# Patient Record
Sex: Male | Born: 2000 | Hispanic: Yes | Marital: Single | Smoking: Former smoker
Health system: Southern US, Community
[De-identification: ages and names within clinical notes are randomized; demographics above are authoritative.]

## PROBLEM LIST (undated history)

## (undated) DIAGNOSIS — M549 Dorsalgia, unspecified: Secondary | ICD-10-CM

## (undated) DIAGNOSIS — M419 Scoliosis, unspecified: Secondary | ICD-10-CM

## (undated) HISTORY — PX: TONSILLECTOMY: SUR1361

## (undated) HISTORY — PX: RHINOPLASTY FOR CLEFT LIP / PALATE: SUR1285

## (undated) HISTORY — PX: CLEFT PALATE REPAIR: SUR1165

---
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
## 2018-03-19 ENCOUNTER — Other Ambulatory Visit: Payer: Self-pay

## 2018-03-19 ENCOUNTER — Emergency Department (HOSPITAL_COMMUNITY): Payer: Medicaid Other

## 2018-03-19 ENCOUNTER — Encounter (HOSPITAL_COMMUNITY): Payer: Self-pay | Admitting: Emergency Medicine

## 2018-03-19 ENCOUNTER — Emergency Department (HOSPITAL_COMMUNITY)
Admission: EM | Admit: 2018-03-19 | Discharge: 2018-03-19 | Disposition: A | Discharge status: Home / Self Care | Payer: Medicaid Other | Attending: Emergency Medicine | Admitting: Emergency Medicine

## 2018-03-19 DIAGNOSIS — R0789 Other chest pain: Secondary | ICD-10-CM | POA: Diagnosis not present

## 2018-03-19 DIAGNOSIS — F17200 Nicotine dependence, unspecified, uncomplicated: Secondary | ICD-10-CM | POA: Diagnosis not present

## 2018-03-19 DIAGNOSIS — R0982 Postnasal drip: Secondary | ICD-10-CM | POA: Diagnosis not present

## 2018-03-19 DIAGNOSIS — R0602 Shortness of breath: Secondary | ICD-10-CM | POA: Insufficient documentation

## 2018-03-19 DIAGNOSIS — J029 Acute pharyngitis, unspecified: Secondary | ICD-10-CM | POA: Diagnosis not present

## 2018-03-19 DIAGNOSIS — H6121 Impacted cerumen, right ear: Secondary | ICD-10-CM | POA: Insufficient documentation

## 2018-03-19 DIAGNOSIS — J069 Acute upper respiratory infection, unspecified: Secondary | ICD-10-CM | POA: Diagnosis not present

## 2018-03-19 DIAGNOSIS — R0981 Nasal congestion: Secondary | ICD-10-CM | POA: Diagnosis not present

## 2018-03-19 DIAGNOSIS — R05 Cough: Secondary | ICD-10-CM | POA: Diagnosis present

## 2018-03-19 DIAGNOSIS — B9789 Other viral agents as the cause of diseases classified elsewhere: Secondary | ICD-10-CM | POA: Diagnosis not present

## 2018-03-19 MED ORDER — FLUTICASONE PROPIONATE 50 MCG/ACT NA SUSP: 2.0000 | Freq: Every day | NASAL | 0 refills | Status: DC

## 2018-03-19 MED ORDER — DIPHENHYDRAMINE HCL 25 MG PO TABS: 25.0000 mg | ORAL_TABLET | Freq: Every evening | ORAL | 0 refills | Status: DC | PRN

## 2018-03-19 MED ORDER — CETIRIZINE HCL 10 MG PO TABS: 10.0000 mg | ORAL_TABLET | Freq: Every day | ORAL | 0 refills | Status: DC

## 2018-03-19 NOTE — ED Provider Notes (Signed)
[facility]-EMERGENCY DEPT Provider Note   CSN: [id number] Arrival date & time: 03/19/18  1952     History   Chief Complaint Chief Complaint  Patient presents with  . Cough  . Nasal Congestion    HPI Zachary Soto is a 17 y.o. male presents today for evaluation of acute onset, per aggressively worsening nasal congestion and cough for 5 days.  Patient states that he thinks this is seasonal allergies although he states he has never had allergies before.  He notes a cough productive of yellow sputum intermittently as well as intermittent shortness of breath which worsens when laying flat.  Notes a very mild aching sensation to the anterior chest wall when coughing only.  He denies fevers or chills.  He notes a mildly scratchy throat.  His tonsils were removed several years ago.  He has tried [employer] and over-the-counter cold medicine without relief of his symptoms.  He states his symptoms worsen when he works outside but he has been wearing a mask with which provide some relief.  No known sick contacts.  He is a non-smoker.  The history is provided by the patient.    Past Medical History:  Diagnosis Date  . Back pain   . Scoliosis     Patient Active Problem List   Diagnosis Date Noted  . Depression 07/06/2016  . 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 CLEFT PALATE REPAIR    . TONSILLECTOMY          Home Medications    Prior to Admission medications   Medication Sig Start Date End Date Taking? Authorizing Provider  cetirizine (ZYRTEC ALLERGY) 10 MG tablet Take 1 tablet (10 mg total) by mouth daily. 03/19/18   Demetric Parslow A, PA-C  diphenhydrAMINE (BENADRYL) 25 MG tablet Take 1 tablet (25 mg total) by mouth at bedtime as needed for itching or allergies. 03/19/18   Jazmaine Fuelling A, PA-C  fluticasone (FLONASE) 50 MCG/ACT nasal spray Place 2 sprays into both nostrils daily. 03/19/18   Jeanie Sewer, PA-C     Family History No family history on file.  Social History Social History   Tobacco Use  . Smoking status: Current Some Day Smoker  . Smokeless tobacco: Never Used  Substance Use Topics  . Alcohol use: Yes    Comment: "blunts this morning"  . Drug use: Yes    Types: Marijuana     Allergies   Patient has no known allergies.   Review of Systems Review of Systems  Constitutional: Negative for chills and fever.  HENT: Positive for congestion, postnasal drip and sore throat. Negative for drooling and trouble swallowing.   Respiratory: Positive for cough and shortness of breath.   Cardiovascular: Positive for chest pain (with cough only).     Physical Exam Updated Vital Signs BP (!) 144/93 (BP Location: Right Arm)   Pulse 82   Temp 98.6 F (37 C) (Oral)   Resp 16   Ht 5\' 5"  (1.651 m)   Wt 72.6 kg (160 lb)   SpO2 99%   BMI 26.63 kg/m   Physical Exam  Constitutional: He appears well-developed and well-nourished. No distress.  HENT:  Head: Normocephalic and atraumatic.  Right Ear: External ear normal.  Left Ear: External ear normal.  Right TM with tympanostomy tube, no mid ear effusion noted.  Right TM not visualized due to cerumen impaction.  No frontal or maxillary sinus tenderness.  Nasal  septum is midline with mucosal edema and clear rhinorrhea bilaterally.  Patient audibly congested.  He has well-healed surgical scars along the hard palate and upper lip to suggest cleft palate surgery.  Posterior oropharynx shows surgically absent tonsils, no erythema, no uvular deviation, no trismus.  There is postnasal drip.  Eyes: Pupils are equal, round, and reactive to light. Conjunctivae and EOM are normal. Right eye exhibits no discharge. Left eye exhibits no discharge.  Neck: Normal range of motion and full passive range of motion without pain. Neck supple. No JVD present. No tracheal deviation present.  Cardiovascular: Normal rate, regular rhythm and normal heart sounds.   Pulmonary/Chest: Effort normal and breath sounds normal. No stridor. No respiratory distress. He has no wheezes. He has no rales. He exhibits no tenderness.  Abdominal: Soft. Bowel sounds are normal. He exhibits no distension.  Musculoskeletal: He exhibits no edema.  Lymphadenopathy:    He has no cervical adenopathy.  Neurological: He is alert.  Skin: Skin is warm and dry. No erythema.  Psychiatric: He has a normal mood and affect. His behavior is normal.  Nursing note and vitals reviewed.    ED Treatments / Results  Labs (all labs ordered are listed, but only abnormal results are displayed) Labs Reviewed - No data to display  EKG None  Radiology Dg Chest 2 View  Result Date: 03/19/2018 CLINICAL DATA:  Cough and congestion EXAM: CHEST - 2 VIEW COMPARISON:  July 04, 2016 FINDINGS: Lungs are clear. Heart size and pulmonary vascularity are normal. No adenopathy. No bone lesions. IMPRESSION: No edema or consolidation. Electronically Signed   By: Bretta BangWilliam  Woodruff III M.D.   On: 03/19/2018 20:37    Procedures Procedures (including critical care time)  Medications Ordered in ED Medications - No data to display   Initial Impression / Assessment and Plan / ED Course  I have reviewed the triage vital signs and the nursing notes.  Pertinent labs & imaging results that were available during my care of the patient were reviewed by me and considered in my medical decision making (see chart for details).     Pt CXR negative for acute infiltrate. Patients symptoms are consistent with URI, likely viral etiology.  Seasonal allergies are also on the differential.  Patient is afebrile, vital signs are stable.  He is nontoxic in appearance.  No fever or meningeal signs to suggest meningitis presentation is not concerning for strep pharyngitis, PTA, or spread of infection to soft tissues.  Discussed that antibiotics are not indicated for viral infections. Pt will be discharged with symptomatic  treatment.  Recommend primary care follow-up if symptoms persist.  Discussed ED return precautions. Pt verbalized understanding of and agreement with plan and is safe for discharge home at this time.   Final Clinical Impressions(s) / ED Diagnoses   Final diagnoses:  Viral URI with cough    ED Discharge Orders        Ordered    fluticasone (FLONASE) 50 MCG/ACT nasal spray  Daily     03/19/18 2053    cetirizine (ZYRTEC ALLERGY) 10 MG tablet  Daily     03/19/18 2053    diphenhydrAMINE (BENADRYL) 25 MG tablet  At bedtime PRN     03/19/18 2053       Jeanie SewerFawze, Amberly Livas A, PA-C 03/19/18 2345    Tilden Fossaees, Elizabeth, MD 03/20/18 [phone]

## 2018-03-19 NOTE — Discharge Instructions (Addendum)
Your chest x-ray did not show any signs of pneumonia or fluid in the lungs.  Drink plenty of water and get plenty of rest.  Gargle warm salt water and spit it out for sore throat. May also use cough drops, warm teas, etc. Take flonase to decrease nasal congestion. Zyrtec for nasal congestion and scratchy throat.  Stop taking Allegra.  You may take Benadryl at night for further relief of your symptoms.  Alternate 600 mg of ibuprofen and [phone] mg of Tylenol every 3 hours as needed for pain. Do not exceed 4000 mg of Tylenol daily.   Followup with your primary care doctor in 5-7 days for recheck of ongoing symptoms. Return to emergency department for emergent changing or worsening of symptoms such as throat tightness, facial swelling, fever not controlled by ibuprofen or Tylenol,difficulty breathing, or chest pain.

## 2018-03-19 NOTE — ED Triage Notes (Signed)
Patient c/o nasal congestion and cough since Tuesday. Tried OTC medication with no relief. Speaking in full sentences.

## 2018-11-11 ENCOUNTER — Other Ambulatory Visit: Payer: Self-pay

## 2018-11-11 ENCOUNTER — Emergency Department (HOSPITAL_COMMUNITY)
Admission: EM | Admit: 2018-11-11 | Discharge: 2018-11-12 | Disposition: A | Discharge status: Home / Self Care | Payer: Medicaid Other | Attending: Emergency Medicine | Admitting: Emergency Medicine

## 2018-11-11 ENCOUNTER — Encounter (HOSPITAL_COMMUNITY): Payer: Self-pay | Admitting: Emergency Medicine

## 2018-11-11 DIAGNOSIS — S0502XA Injury of conjunctiva and corneal abrasion without foreign body, left eye, initial encounter: Secondary | ICD-10-CM | POA: Insufficient documentation

## 2018-11-11 DIAGNOSIS — X58XXXA Exposure to other specified factors, initial encounter: Secondary | ICD-10-CM | POA: Diagnosis not present

## 2018-11-11 DIAGNOSIS — Y9389 Activity, other specified: Secondary | ICD-10-CM | POA: Insufficient documentation

## 2018-11-11 DIAGNOSIS — F17200 Nicotine dependence, unspecified, uncomplicated: Secondary | ICD-10-CM | POA: Insufficient documentation

## 2018-11-11 DIAGNOSIS — Y929 Unspecified place or not applicable: Secondary | ICD-10-CM | POA: Diagnosis not present

## 2018-11-11 DIAGNOSIS — Y999 Unspecified external cause status: Secondary | ICD-10-CM | POA: Diagnosis not present

## 2018-11-11 DIAGNOSIS — S058X2A Other injuries of left eye and orbit, initial encounter: Secondary | ICD-10-CM | POA: Diagnosis present

## 2018-11-11 MED ORDER — FLUORESCEIN SODIUM 1 MG OP STRP
1.0000 | ORAL_STRIP | Freq: Once | OPHTHALMIC | Status: AC
  Administered 2018-11-12: 1 via OPHTHALMIC
  Filled 2018-11-11: qty 1

## 2018-11-11 MED ORDER — TETRACAINE HCL 0.5 % OP SOLN
2.0000 [drp] | Freq: Once | OPHTHALMIC | Status: AC
  Administered 2018-11-12: 2 [drp] via OPHTHALMIC
  Filled 2018-11-11: qty 4

## 2018-11-11 NOTE — ED Notes (Signed)
All materials at bedside. Eye irrigated

## 2018-11-11 NOTE — ED Triage Notes (Signed)
Pt from home with pain in left eye following possibly getting brick dust in eye at work. Pt has sensitivity to light. No foreign objects seen, but sclera is red

## 2018-11-12 MED ORDER — ERYTHROMYCIN 5 MG/GM OP OINT: TOPICAL_OINTMENT | OPHTHALMIC | 0 refills | Status: DC

## 2018-11-12 MED ORDER — ERYTHROMYCIN 5 MG/GM OP OINT
1.0000 "application " | TOPICAL_OINTMENT | Freq: Once | OPHTHALMIC | Status: AC
  Administered 2018-11-12: 1 via OPHTHALMIC
  Filled 2018-11-12: qty 3.5

## 2018-11-12 NOTE — Discharge Instructions (Signed)
1. Medications: Erythromycin (half inch ribbon of ointment into the lower eyelid every 4 hours for the next 5 days), usual home medications 2. Treatment: rest, drink plenty of fluids, try not to rub eye, Tylenol or ibuprofen for pain 3. Follow Up: Please followup with ophthalmology for further evaluation of your eye on Monday.  Please return to the ER for swelling of the eye, changes in vision, fevers or other concerns.

## 2018-11-12 NOTE — ED Provider Notes (Signed)
[facility]-EMERGENCY DEPT Provider Note   CSN: [id number] Arrival date & time: 11/11/18  2039     History   Chief Complaint Chief Complaint  Patient presents with  . Eye Pain    HPI Zachary LungerKevin Julio Soto Zachary Soto is a 17 y.o. male with no major medical problems presents to the Emergency Department complaining of acute, persistent left eye pain and irritation onset around 3:30 PM while working.  He reports a colleague was cutting bricks when he got some dust in his eye.  He did not wash his eye then but did wash it after returning from work later this evening.  He reports persistent feeling of foreign body.  He denies vision changes, fever, chills.  He denies being hit in the eye.  He does not wear contacts or glasses.  No aggravating or alleviating factors.  No treatments prior to arrival.   The history is provided by the patient, medical records and a significant other. No language interpreter was used.    Past Medical History:  Diagnosis Date  . Back pain   . Scoliosis     Patient Active Problem List   Diagnosis Date Noted  . Depression 07/06/2016  . 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 CLEFT PALATE REPAIR    . TONSILLECTOMY          Home Medications    Prior to Admission medications   Medication Sig Start Date End Date Taking? Authorizing Provider  erythromycin ophthalmic ointment Place a 1/2 inch ribbon of ointment into the lower eyelid every 4 hours for 5 days 11/12/18   Allessandra Bernardi, Dahlia ClientHannah, PA-C    Family History No family history on file.  Social History Social History   Tobacco Use  . Smoking status: Current Some Day Smoker  . Smokeless tobacco: Never Used  Substance Use Topics  . Alcohol use: Yes    Comment: "blunts this morning"  . Drug use: Yes    Types: Marijuana     Allergies   Patient has no known allergies.   Review of Systems Review of Systems  Constitutional: Negative  for fever.  HENT: Negative for congestion and facial swelling.   Eyes: Positive for photophobia, pain and redness.  Respiratory: Negative for shortness of breath.   Cardiovascular: Negative for chest pain.  Gastrointestinal: Negative for abdominal pain and vomiting.  Musculoskeletal: Negative for neck pain and neck stiffness.  Skin: Negative for wound.  Allergic/Immunologic: Negative for immunocompromised state.  Neurological: Negative for dizziness and light-headedness.  Hematological: Negative for adenopathy.  Psychiatric/Behavioral: Negative for confusion.     Physical Exam Updated Vital Signs BP (!) 135/99 (BP Location: Left Arm)   Pulse 67   Temp 97.6 F (36.4 C) (Oral)   Resp 18   Wt 71.2 kg   SpO2 98%   BMI 26.13 kg/m   Physical Exam  Constitutional: He is oriented to person, place, and time. He appears well-developed and well-nourished. No distress.  HENT:  Head: Normocephalic and atraumatic.  Nose: Nose normal. No mucosal edema or rhinorrhea.  Mouth/Throat: Uvula is midline, oropharynx is clear and moist and mucous membranes are normal. No uvula swelling. No oropharyngeal exudate, posterior oropharyngeal edema, posterior oropharyngeal erythema or tonsillar abscesses.  Eyes: Pupils are equal, round, and reactive to light. Conjunctivae, EOM and lids are normal. Lids are everted and swept, no foreign bodies found. Right eye exhibits no chemosis, no discharge and no exudate. No foreign body present  in the right eye. Left eye exhibits no chemosis, no discharge and no exudate. No foreign body present in the left eye. Right conjunctiva is not injected. Right conjunctiva has no hemorrhage. Left conjunctiva is not injected. Left conjunctiva has no hemorrhage.  Slit lamp exam:      The right eye shows no corneal abrasion, no corneal flare, no corneal ulcer, no foreign body, no fluorescein uptake and no anterior chamber bulge.       The left eye shows no corneal abrasion, no corneal  flare, no corneal ulcer, no foreign body, no fluorescein uptake and no anterior chamber bulge.  Pupils equal round and reactive to light No vertical, horizontal or rotational nystagmus Small corneal abrasion noted to the left eye at the 1-3 o'clock position with fluorescein uptake No visible foreign body No corneal flare, ulcer or dendritic staining  No herpetic lesions to the face or around the eye  Visual Acuity:  R - 20/50 L - 20/50  Neck: Normal range of motion.  Full range of motion without pain No midline or paraspinal tenderness No nuchal rigidity; no meningeal signs  Cardiovascular: Normal rate, regular rhythm and intact distal pulses.  Pulmonary/Chest: Effort normal. No respiratory distress.  Musculoskeletal: Normal range of motion.  Neurological: He is alert and oriented to person, place, and time.  Mental Status:  Alert, oriented, thought content appropriate. Speech fluent without evidence of aphasia. Able to follow 2 step commands without difficulty.   Cranial Nerves:  II:  Peripheral visual fields grossly normal, pupils equal, round, reactive to light III,IV, VI: ptosis not present, extra-ocular motions intact bilaterally  V,VII: smile symmetric, facial light touch sensation equal VIII: hearing grossly normal bilaterally  IX,X: gag reflex present  XI: bilateral shoulder shrug equal and strong XII: midline tongue extension   Skin: Skin is warm and dry. He is not diaphoretic. No erythema.  Psychiatric: He has a normal mood and affect.  Nursing note and vitals reviewed.    ED Treatments / Results   Procedures Procedures (including critical care time)  Medications Ordered in ED Medications  erythromycin ophthalmic ointment 1 application (has no administration in time range)  tetracaine (PONTOCAINE) 0.5 % ophthalmic solution 2 drop (2 drops Left Eye Given by Other 11/12/18 0033)  fluorescein ophthalmic strip 1 strip (1 strip Left Eye Given by Other 11/12/18 0034)      Initial Impression / Assessment and Plan / ED Course  I have reviewed the triage vital signs and the nursing notes.  Pertinent labs & imaging results that were available during my care of the patient were reviewed by me and considered in my medical decision making (see chart for details).     With eye irritation and feeling of foreign body.  I flushed with normal saline.  No visible foreign body.  Slit-lamp exam shows no embedded foreign body.  Fluorescein exam with small corneal abrasion.  Patient does not wear contact lenses.  No signs of current infection.  Negative Seidel sign and no evidence of globe rupture.  Patient given erythromycin and will be discharged home with close ophthalmology follow-up.  Final Clinical Impressions(s) / ED Diagnoses   Final diagnoses:  Abrasion of left cornea, initial encounter    ED Discharge Orders         Ordered    erythromycin ophthalmic ointment     11/12/18 0038           Cezar Misiaszek, Dahlia Client, PA-C 11/12/18 0041    Cardama, Amadeo Garnet, MD  11/12/18 0753  

## 2018-11-12 NOTE — ED Notes (Signed)
Pt verbalized discharge instructions and follow up care.   

## 2019-01-20 ENCOUNTER — Encounter (HOSPITAL_COMMUNITY): Payer: Self-pay | Admitting: Emergency Medicine

## 2019-01-20 ENCOUNTER — Ambulatory Visit (HOSPITAL_COMMUNITY)
Admission: EM | Admit: 2019-01-20 | Discharge: 2019-01-20 | Disposition: A | Discharge status: Home / Self Care | Payer: Medicaid Other | Attending: Internal Medicine | Admitting: Internal Medicine

## 2019-01-20 ENCOUNTER — Emergency Department (HOSPITAL_COMMUNITY)
Admission: EM | Admit: 2019-01-20 | Discharge: 2019-01-20 | Discharge status: Absent without leave | Payer: Medicaid Other

## 2019-01-20 DIAGNOSIS — A084 Viral intestinal infection, unspecified: Secondary | ICD-10-CM | POA: Diagnosis not present

## 2019-01-20 NOTE — ED Triage Notes (Signed)
Pt states on Wednesday he had fever and nausea and states he feels 100% better but his job requires him to have a note saying he can return to work.

## 2019-01-20 NOTE — ED Provider Notes (Signed)
[facility]    CSN: [id number] Arrival date & time: 01/20/19  1727     History   Chief Complaint Chief Complaint  Patient presents with  . Letter for School/Work    HPI Zachary Soto is a 18 y.o. male with a history of chronic back pain comes into the urgent care department with a request for letter so he can return to work.  Patient had been unwell with fever, nausea and diarrhea over the past couple of days.  He did not attend work.  His symptoms are beginning to improve.  Diarrhea has subsided.  Oral intake is improving.  Fever has subsided as well.  No relieving factors.  Patient denies any shortness of breath, chest pain at this time.   HPI  Past Medical History:  Diagnosis Date  . Back pain   . Scoliosis     Patient Active Problem List   Diagnosis Date Noted  . Depression 07/06/2016  . 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 CLEFT PALATE REPAIR    . TONSILLECTOMY         Home Medications    Prior to Admission medications   Medication Sig Start Date End Date Taking? Authorizing Provider  erythromycin ophthalmic ointment Place a 1/2 inch ribbon of ointment into the lower eyelid every 4 hours for 5 days Patient not taking: Reported on 01/20/2019 11/12/18   Muthersbaugh, Dahlia Client, PA-C    Family History No family history on file.  Social History Social History   Tobacco Use  . Smoking status: Current Some Day Smoker  . Smokeless tobacco: Never Used  Substance Use Topics  . Alcohol use: Yes    Comment: "blunts this morning"  . Drug use: Yes    Types: Marijuana     Allergies   Patient has no known allergies.   Review of Systems Review of Systems  Constitutional: Negative for activity change, appetite change and chills.  HENT: Negative for ear discharge, ear pain and rhinorrhea.   Respiratory: Negative for cough and shortness of breath.   Gastrointestinal: Negative for abdominal  distention, abdominal pain and nausea.  Musculoskeletal: Positive for arthralgias and back pain. Negative for gait problem, myalgias and neck pain.  Neurological: Negative for dizziness, weakness and light-headedness.     Physical Exam Triage Vital Signs ED Triage Vitals [01/20/19 1745]  Enc Vitals Group     BP (!) 167/93     Pulse Rate 74     Resp 18     Temp 98.7 F (37.1 C)     Temp src      SpO2 100 %     Weight      Height      Head Circumference      Peak Flow      Pain Score 0     Pain Loc      Pain Edu?      Excl. in GC?    No data found.  Updated Vital Signs BP (!) 167/93   Pulse 74   Temp 98.7 F (37.1 C)   Resp 18   SpO2 100%   Visual Acuity Right Eye Distance:   Left Eye Distance:   Bilateral Distance:    Right Eye Near:   Left Eye Near:    Bilateral Near:     Physical Exam Constitutional:      Appearance: Normal appearance. He is not ill-appearing.  HENT:  Right Ear: Tympanic membrane normal.     Left Ear: Tympanic membrane normal.  Cardiovascular:     Rate and Rhythm: Normal rate and regular rhythm.     Pulses: Normal pulses.     Heart sounds: Normal heart sounds.  Pulmonary:     Effort: Pulmonary effort is normal. No respiratory distress.     Breath sounds: Normal breath sounds. No wheezing.  Abdominal:     General: Abdomen is flat. Bowel sounds are normal. There is no distension.     Palpations: Abdomen is soft.     Tenderness: There is no abdominal tenderness.  Musculoskeletal: Normal range of motion.        General: No swelling or deformity.  Neurological:     Mental Status: He is alert.      UC Treatments / Results  Labs (all labs ordered are listed, but only abnormal results are displayed) Labs Reviewed - No data to display  EKG None  Radiology No results found.  Procedures Procedures (including critical care time)  Medications Ordered in UC Medications - No data to display  Initial Impression / Assessment  and Plan / UC Course  I have reviewed the triage vital signs and the nursing notes.  Pertinent labs & imaging results that were available during my care of the patient were reviewed by me and considered in my medical decision making (see chart for details).     1.  Acute gastroenteritis likely viral-improving: Continue oral intake Patient is clinically better. Return to work note is given to the patient  Final Clinical Impressions(s) / UC Diagnoses   Final diagnoses:  None   Discharge Instructions   None    ED Prescriptions    None     Controlled Substance Prescriptions [place] Controlled Substance Registry consulted? Not Applicable   Merrilee Jansky, MD 01/23/19 1001

## 2019-02-06 ENCOUNTER — Encounter (HOSPITAL_COMMUNITY): Payer: Self-pay | Admitting: Emergency Medicine

## 2019-02-06 ENCOUNTER — Emergency Department (HOSPITAL_COMMUNITY)
Admission: EM | Admit: 2019-02-06 | Discharge: 2019-02-06 | Disposition: A | Discharge status: Home / Self Care | Payer: Medicaid Other | Attending: Emergency Medicine | Admitting: Emergency Medicine

## 2019-02-06 DIAGNOSIS — R112 Nausea with vomiting, unspecified: Secondary | ICD-10-CM | POA: Diagnosis not present

## 2019-02-06 DIAGNOSIS — R197 Diarrhea, unspecified: Secondary | ICD-10-CM | POA: Insufficient documentation

## 2019-02-06 DIAGNOSIS — R05 Cough: Secondary | ICD-10-CM

## 2019-02-06 DIAGNOSIS — F17200 Nicotine dependence, unspecified, uncomplicated: Secondary | ICD-10-CM | POA: Diagnosis not present

## 2019-02-06 DIAGNOSIS — R059 Cough, unspecified: Secondary | ICD-10-CM

## 2019-02-06 LAB — CBC
HCT: 46.2 % (ref 39.0–52.0)
Hemoglobin: 15 g/dL (ref 13.0–17.0)
MCH: 31.4 pg (ref 26.0–34.0)
MCHC: 32.5 g/dL (ref 30.0–36.0)
MCV: 96.7 fL (ref 80.0–100.0)
Platelets: 243 10*3/uL (ref 150–400)
RBC: 4.78 MIL/uL (ref 4.22–5.81)
RDW: 12.2 % (ref 11.5–15.5)
WBC: 6.8 10*3/uL (ref 4.0–10.5)
nRBC: 0 % (ref 0.0–0.2)

## 2019-02-06 LAB — COMPREHENSIVE METABOLIC PANEL
ALT: 53 U/L — ABNORMAL HIGH (ref 0–44)
AST: 42 U/L — ABNORMAL HIGH (ref 15–41)
Albumin: 4.8 g/dL (ref 3.5–5.0)
Alkaline Phosphatase: 89 U/L (ref 38–126)
Anion gap: 6 (ref 5–15)
BUN: 15 mg/dL (ref 6–20)
CO2: 27 mmol/L (ref 22–32)
Calcium: 9 mg/dL (ref 8.9–10.3)
Chloride: 106 mmol/L (ref 98–111)
Creatinine, Ser: 0.84 mg/dL (ref 0.61–1.24)
GFR calc non Af Amer: >60 mL/min (ref >60)
Glucose, Bld: 134 mg/dL — ABNORMAL HIGH (ref 70–99)
Potassium: 3.5 mmol/L (ref 3.5–5.1)
Sodium: 139 mmol/L (ref 135–145)
Total Bilirubin: 0.5 mg/dL (ref 0.3–1.2)
Total Protein: 7.2 g/dL (ref 6.5–8.1)

## 2019-02-06 LAB — LIPASE, BLOOD: Lipase: 29 U/L (ref 11–51)

## 2019-02-06 MED ORDER — METOCLOPRAMIDE HCL 10 MG PO TABS: 10.0000 mg | ORAL_TABLET | Freq: Four times a day (QID) | ORAL | 0 refills | Status: DC | PRN

## 2019-02-06 NOTE — ED Provider Notes (Signed)
[facility]-EMERGENCY DEPT Provider Note   CSN: [id number] Arrival date & time: 02/06/19  1605    History   Chief Complaint Chief Complaint  Patient presents with  . Cough  . Emesis  . Diarrhea    HPI Zachary Soto Zachary Soto is a 18 y.o. male here presenting with cough, nausea and vomiting and diarrhea.  Patient states that he had to miss work because he was coughing and had an episode of nausea vomiting.  He also had some loose stools this morning.  States that he is able to keep down some fluids after he vomited.  Denies any fevers or chills.  About 2 weeks ago, he went to urgent care for similar symptoms and states that it resolved on its own.  He told me that he needs a work note to go back to work.     The history is provided by the patient.    Past Medical History:  Diagnosis Date  . Back pain   . Scoliosis     Patient Active Problem List   Diagnosis Date Noted  . Depression 07/06/2016  . 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 CLEFT PALATE REPAIR    . TONSILLECTOMY          Home Medications    Prior to Admission medications   Medication Sig Start Date End Date Taking? Authorizing Provider  erythromycin ophthalmic ointment Place a 1/2 inch ribbon of ointment into the lower eyelid every 4 hours for 5 days Patient not taking: Reported on 01/20/2019 11/12/18   Muthersbaugh, Dahlia Client, PA-C    Family History No family history on file.  Social History Social History   Tobacco Use  . Smoking status: Current Some Day Smoker  . Smokeless tobacco: Never Used  Substance Use Topics  . Alcohol use: Yes    Comment: "blunts this morning"  . Drug use: Yes    Types: Marijuana     Allergies   Patient has no known allergies.   Review of Systems Review of Systems  Respiratory: Positive for cough.   Gastrointestinal: Positive for diarrhea and vomiting.  All other systems reviewed and are  negative.    Physical Exam Updated Vital Signs BP 139/73 (BP Location: Right Arm)   Pulse 72   Temp (!) 97.3 F (36.3 C) (Oral)   Resp 16   SpO2 99%   Physical Exam Vitals signs and nursing note reviewed.  HENT:     Head: Normocephalic.     Right Ear: Tympanic membrane normal.     Left Ear: Tympanic membrane normal.     Nose: Nose normal.     Mouth/Throat:     Mouth: Mucous membranes are moist.  Eyes:     Extraocular Movements: Extraocular movements intact.     Pupils: Pupils are equal, round, and reactive to light.  Neck:     Musculoskeletal: Normal range of motion.  Cardiovascular:     Rate and Rhythm: Normal rate and regular rhythm.  Pulmonary:     Effort: Pulmonary effort is normal.     Breath sounds: Normal breath sounds.  Abdominal:     General: Abdomen is flat.     Palpations: Abdomen is soft.  Musculoskeletal: Normal range of motion.  Skin:    General: Skin is warm.     Capillary Refill: Capillary refill takes less than 2 seconds.  Neurological:     General: No focal deficit present.  Mental Status: He is alert.  Psychiatric:        Mood and Affect: Mood normal.        Behavior: Behavior normal.      ED Treatments / Results  Labs (all labs ordered are listed, but only abnormal results are displayed) Labs Reviewed  COMPREHENSIVE METABOLIC PANEL - Abnormal; Notable for the following components:      Result Value   Glucose, Bld 134 (*)    AST 42 (*)    ALT 53 (*)    All other components within normal limits  LIPASE, BLOOD  CBC  URINALYSIS, ROUTINE W REFLEX MICROSCOPIC    EKG None  Radiology No results found.  Procedures Procedures (including critical care time)  Medications Ordered in ED Medications - No data to display   Initial Impression / Assessment and Plan / ED Course  I have reviewed the triage vital signs and the nursing notes.  Pertinent labs & imaging results that were available during my care of the patient were  reviewed by me and considered in my medical decision making (see chart for details).       Gionny Heidorn Shelley Barbian is a 18 y.o. male here with cough, vomiting, diarrhea. Lungs clear. Abdomen nontender. Tolerated PO prior to arrival. Labs showed mildly elevated LFTs but abdomen nontender. I think likely viral gastro. Will dc home with reglan prn. Able to go back to work.    Final Clinical Impressions(s) / ED Diagnoses   Final diagnoses:  None    ED Discharge Orders    None       Charlynne Pander, MD 02/06/19 2123

## 2019-02-06 NOTE — Discharge Instructions (Signed)
Stay hydrated   You may go back to work   Take reglan as needed for nausea or vomiting   See your doctor  Return to ER if you have worse vomiting, abdominal pain, cough, fever

## 2019-02-06 NOTE — ED Triage Notes (Signed)
Pt reports that yesterday started having cough with n/v/d.

## 2019-11-07 ENCOUNTER — Encounter: Payer: Self-pay | Admitting: Emergency Medicine

## 2019-11-07 ENCOUNTER — Other Ambulatory Visit: Payer: Self-pay

## 2019-11-07 ENCOUNTER — Emergency Department
Admission: EM | Admit: 2019-11-07 | Discharge: 2019-11-07 | Disposition: A | Discharge status: Home / Self Care | Payer: Medicaid Other | Attending: Student in an Organized Health Care Education/Training Program | Admitting: Student in an Organized Health Care Education/Training Program

## 2019-11-07 DIAGNOSIS — H6501 Acute serous otitis media, right ear: Secondary | ICD-10-CM | POA: Diagnosis not present

## 2019-11-07 DIAGNOSIS — H9203 Otalgia, bilateral: Secondary | ICD-10-CM | POA: Diagnosis present

## 2019-11-07 DIAGNOSIS — H6500 Acute serous otitis media, unspecified ear: Secondary | ICD-10-CM

## 2019-11-07 DIAGNOSIS — Z87891 Personal history of nicotine dependence: Secondary | ICD-10-CM | POA: Insufficient documentation

## 2019-11-07 MED ORDER — PREDNISONE 10 MG PO TABS: 30.0000 mg | ORAL_TABLET | Freq: Every day | ORAL | 0 refills | Status: DC

## 2019-11-07 MED ORDER — AMOXICILLIN-POT CLAVULANATE 875-125 MG PO TABS: 1.0000 | ORAL_TABLET | Freq: Two times a day (BID) | ORAL | 0 refills | Status: AC

## 2019-11-07 MED ORDER — HYDROCODONE-ACETAMINOPHEN 5-325 MG PO TABS: 1.0000 | ORAL_TABLET | Freq: Four times a day (QID) | ORAL | 0 refills | Status: DC | PRN

## 2019-11-07 NOTE — Discharge Instructions (Addendum)
Follow-up with Dr. Tami Ribas.  Please call for an appointment.  Use medications as prescribed.  Take the pain medication only if needed.  Return if worsening.

## 2019-11-07 NOTE — ED Triage Notes (Signed)
Patient ambulatory to triage with steady gait, without difficulty or distress noted, mask in place; pt reports bilat earache and drainage "for months"; st seen for same 81mos ago and admin med without relief; denies any recent illness or fever

## 2019-11-07 NOTE — ED Provider Notes (Signed)
[facility] Emergency Department Provider Note  ____________________________________________   First MD Initiated Contact with Patient 11/07/19 2128     (approximate)  I have reviewed the triage vital signs and the nursing notes.   HISTORY  Chief Complaint Otalgia    HPI Zachary Soto is a 18 y.o. male presents emergency department complaint of bilateral ear pain.  States he has drainage from the left ear.  Increased pain in the right ear.  No fever or chills.  History of otitis media which was treated 2 months ago.  States he does not feel like it ever cleared up.  He continued to have drainage from the left ear.  States now he cannot hear out of the right ear.  No sore throat.  No headache.    Past Medical History:  Diagnosis Date  . Back pain   . Scoliosis     Patient Active Problem List   Diagnosis Date Noted  . Depression 07/06/2016  . 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 CLEFT PALATE REPAIR    . TONSILLECTOMY      Prior to Admission medications   Medication Sig Start Date End Date Taking? Authorizing Provider  amoxicillin-clavulanate (AUGMENTIN) 875-125 MG tablet Take 1 tablet by mouth 2 (two) times daily for 7 days. 11/07/19 11/14/19  Jerry Haugen, Roselyn Bering, PA-C  HYDROcodone-acetaminophen (NORCO/VICODIN) 5-325 MG tablet Take 1 tablet by mouth every 6 (six) hours as needed for moderate pain. 11/07/19   Eberardo Demello, Roselyn Bering, PA-C  metoCLOPramide (REGLAN) 10 MG tablet Take 1 tablet (10 mg total) by mouth every 6 (six) hours as needed for nausea (nausea/headache). 02/06/19   Charlynne Pander, MD  predniSONE (DELTASONE) 10 MG tablet Take 3 tablets (30 mg total) by mouth daily with breakfast. 11/07/19   Sherrie Mustache Roselyn Bering, PA-C    Allergies Patient has no known allergies.  No family history on file.  Social History Social History   Tobacco Use  . Smoking status: Former Games developer  . Smokeless tobacco:  Never Used  Substance Use Topics  . Alcohol use: Yes    Comment: "blunts this morning"  . Drug use: Yes    Types: Marijuana    Review of Systems  Constitutional: No fever/chills Eyes: No visual changes. ENT: No sore throat.  Positive for bilateral ear pain Respiratory: Denies cough Genitourinary: Negative for dysuria. Musculoskeletal: Negative for back pain. Skin: Negative for rash.    ____________________________________________   PHYSICAL EXAM:  VITAL SIGNS: ED Triage Vitals [11/07/19 1944]  Enc Vitals Group     BP (!) 143/92     Pulse Rate 90     Resp 18     Temp 98.5 F (36.9 C)     Temp Source Oral     SpO2 100 %     Weight 160 lb (72.6 kg)     Height 5\' 5"  (1.651 m)     Head Circumference      Peak Flow      Pain Score 8     Pain Loc      Pain Edu?      Excl. in GC?     Constitutional: Alert and oriented. Well appearing and in no acute distress. Eyes: Conjunctivae are normal.  Head: Atraumatic. Ears: TM on the right is swollen with purulent exudate at the tm, left tm is clear with some drainage in the ear canal Nose: No congestion/rhinnorhea. Mouth/Throat: Mucous membranes are  moist.   Neck:  supple no lymphadenopathy noted Cardiovascular: Normal rate, regular rhythm. Heart sounds are normal Respiratory: Normal respiratory effort.  No retractions, lungs c t a  GU: deferred Musculoskeletal: FROM all extremities, warm and well perfused Neurologic:  Normal speech and language.  Skin:  Skin is warm, dry and intact. No rash noted. Psychiatric: Mood and affect are normal. Speech and behavior are normal.  ____________________________________________   LABS (all labs ordered are listed, but only abnormal results are displayed)  Labs Reviewed - No data to display ____________________________________________   ____________________________________________  RADIOLOGY    ____________________________________________   PROCEDURES  Procedure(s)  performed: No  Procedures    ____________________________________________   INITIAL IMPRESSION / ASSESSMENT AND PLAN / ED COURSE  Pertinent labs & imaging results that were available during my care of the patient were reviewed by me and considered in my medical decision making (see chart for details).   Patient is 18 year old male presents emergency department with complaints of bilateral ear pain.  Physical exam shows bilateral TM swelling.  Drainage noted from the left TM.  Right TM is full of  Pus.  Explained findings to the patient.  Due to the recurrent ear infection I explained that he needs to follow-up with ENT.  He was given prescription for Augmentin.  Prednisone 30 mg daily for 3 days.  And Vicodin for pain.  He states he understands instructions.  Is discharged stable condition.    Zachary Soto was evaluated in Emergency Department on 11/07/2019 for the symptoms described in the history of present illness. He was evaluated in the context of the global COVID-19 pandemic, which necessitated consideration that the patient might be at risk for infection with the SARS-CoV-2 virus that causes COVID-19. Institutional protocols and algorithms that pertain to the evaluation of patients at risk for COVID-19 are in a state of rapid change based on information released by regulatory bodies including the CDC and federal and state organizations. These policies and algorithms were followed during the patient's care in the ED.   As part of my medical decision making, I reviewed the following data within the [place] notes reviewed and incorporated, Old chart reviewed, Notes from prior ED visits and [place] Controlled Substance Database  ____________________________________________   FINAL CLINICAL IMPRESSION(S) / ED DIAGNOSES  Final diagnoses:  Acute serous otitis media, recurrence not specified, unspecified laterality      NEW MEDICATIONS STARTED  DURING THIS VISIT:  New Prescriptions   AMOXICILLIN-CLAVULANATE (AUGMENTIN) 875-125 MG TABLET    Take 1 tablet by mouth 2 (two) times daily for 7 days.   HYDROCODONE-ACETAMINOPHEN (NORCO/VICODIN) 5-325 MG TABLET    Take 1 tablet by mouth every 6 (six) hours as needed for moderate pain.   PREDNISONE (DELTASONE) 10 MG TABLET    Take 3 tablets (30 mg total) by mouth daily with breakfast.     Note:  This document was prepared using Dragon voice recognition software and may include unintentional dictation errors.    Versie Starks, PA-C 11/07/19 2221    Merlyn Lot, MD 11/08/19 [phone]

## 2020-06-17 ENCOUNTER — Emergency Department
Admission: EM | Admit: 2020-06-17 | Discharge: 2020-06-17 | Disposition: A | Discharge status: Home / Self Care | Payer: Medicare Other | Attending: Emergency Medicine | Admitting: Emergency Medicine

## 2020-06-17 ENCOUNTER — Emergency Department: Payer: Medicare Other

## 2020-06-17 ENCOUNTER — Encounter: Payer: Self-pay | Admitting: Emergency Medicine

## 2020-06-17 DIAGNOSIS — Z87891 Personal history of nicotine dependence: Secondary | ICD-10-CM | POA: Insufficient documentation

## 2020-06-17 DIAGNOSIS — Y929 Unspecified place or not applicable: Secondary | ICD-10-CM | POA: Insufficient documentation

## 2020-06-17 DIAGNOSIS — Y939 Activity, unspecified: Secondary | ICD-10-CM | POA: Diagnosis not present

## 2020-06-17 DIAGNOSIS — M25512 Pain in left shoulder: Secondary | ICD-10-CM | POA: Diagnosis not present

## 2020-06-17 DIAGNOSIS — S161XXA Strain of muscle, fascia and tendon at neck level, initial encounter: Secondary | ICD-10-CM | POA: Insufficient documentation

## 2020-06-17 DIAGNOSIS — Y999 Unspecified external cause status: Secondary | ICD-10-CM | POA: Diagnosis not present

## 2020-06-17 DIAGNOSIS — S199XXA Unspecified injury of neck, initial encounter: Secondary | ICD-10-CM | POA: Diagnosis present

## 2020-06-17 DIAGNOSIS — X58XXXA Exposure to other specified factors, initial encounter: Secondary | ICD-10-CM | POA: Diagnosis not present

## 2020-06-17 DIAGNOSIS — M79602 Pain in left arm: Secondary | ICD-10-CM

## 2020-06-17 MED ORDER — IBUPROFEN 600 MG PO TABS
600.0000 mg | ORAL_TABLET | Freq: Once | ORAL | Status: AC
  Administered 2020-06-17: 600 mg via ORAL
  Filled 2020-06-17: qty 1

## 2020-06-17 MED ORDER — NAPROXEN 500 MG PO TABS: 500.0000 mg | ORAL_TABLET | Freq: Two times a day (BID) | ORAL | 0 refills | Status: DC

## 2020-06-17 MED ORDER — CYCLOBENZAPRINE HCL 10 MG PO TABS: 10.0000 mg | ORAL_TABLET | Freq: Three times a day (TID) | ORAL | 0 refills | Status: DC | PRN

## 2020-06-17 MED ORDER — TRAMADOL HCL 50 MG PO TABS
50.0000 mg | ORAL_TABLET | Freq: Once | ORAL | Status: AC
  Administered 2020-06-17: 50 mg via ORAL
  Filled 2020-06-17: qty 1

## 2020-06-17 NOTE — Discharge Instructions (Signed)
Follow discharge care instruction take medication as directed. °

## 2020-06-17 NOTE — ED Notes (Signed)
Pt states he was wrestling with his buddy and he hurt his left shoulder and neck. Pt states pain and difficulty sleeping.

## 2020-06-17 NOTE — ED Triage Notes (Signed)
C/O upper back, neck, and left arm pain since Thursday.  States injured self while wrestling with a friend.  AAOx3.  Skin warm and dry. NAD

## 2020-06-17 NOTE — ED Notes (Signed)
Esign not working pt verbalized discharge instructions  

## 2020-06-17 NOTE — ED Provider Notes (Signed)
[facility] Emergency Department Provider Note   ____________________________________________   First MD Initiated Contact with Patient 06/17/20 1115     (approximate)  I have reviewed the triage vital signs and the nursing notes.   HISTORY  Chief Complaint Back Pain, Neck Pain, and Arm Pain  HPI Zachary Soto is a 19 y.o. male patient complaining of neck and left shoulder pain secondary to a recent incident 4 days ago.  Patient state he fell landing on his left shoulder.  Patient denies LOC or head injury.  Patient also complain of pain with swallowing.  Patient denies radicular component to his neck pain.  Patient rates pain as a 10/10.  Patient described pain is "achy".  Patient the pain increased in the shoulder with abduction and overhead reaching.  No palliative measure for complaint.         Past Medical History:  Diagnosis Date  . Back pain   . Scoliosis     Patient Active Problem List   Diagnosis Date Noted  . Depression 07/06/2016  . 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 CLEFT PALATE REPAIR    . TONSILLECTOMY      Prior to Admission medications   Medication Sig Start Date End Date Taking? Authorizing Provider  cyclobenzaprine (FLEXERIL) 10 MG tablet Take 1 tablet (10 mg total) by mouth 3 (three) times daily as needed. 06/17/20   Joni Reining, PA-C  HYDROcodone-acetaminophen (NORCO/VICODIN) 5-325 MG tablet Take 1 tablet by mouth every 6 (six) hours as needed for moderate pain. 11/07/19   Fisher, Roselyn Bering, PA-C  metoCLOPramide (REGLAN) 10 MG tablet Take 1 tablet (10 mg total) by mouth every 6 (six) hours as needed for nausea (nausea/headache). 02/06/19   Charlynne Pander, MD  naproxen (NAPROSYN) 500 MG tablet Take 1 tablet (500 mg total) by mouth 2 (two) times daily with a meal. 06/17/20   Joni Reining, PA-C  predniSONE (DELTASONE) 10 MG tablet Take 3 tablets (30 mg total) by mouth  daily with breakfast. 11/07/19   Sherrie Mustache Roselyn Bering, PA-C    Allergies Patient has no known allergies.  No family history on file.  Social History Social History   Tobacco Use  . Smoking status: Former Games developer  . Smokeless tobacco: Never Used  Vaping Use  . Vaping Use: Never used  Substance Use Topics  . Alcohol use: Yes    Comment: "blunts this morning"  . Drug use: Yes    Types: Marijuana    Review of Systems Constitutional: No fever/chills Eyes: No visual changes. ENT: No sore throat.dysphagia Cardiovascular: Denies chest pain. Respiratory: Denies shortness of breath. Gastrointestinal: No abdominal pain.  No nausea, no vomiting.  No diarrhea.  No constipation. Genitourinary: Negative for dysuria. Musculoskeletal: Neck and left shoulder pain. Skin: Negative for rash. Neurological: Negative for headaches, focal weakness or numbness. Psychiatric:  Depression and overdose.   ____________________________________________   PHYSICAL EXAM:  VITAL SIGNS: ED Triage Vitals  Enc Vitals Group     BP 06/17/20 1052 (!) 145/98     Pulse Rate 06/17/20 1052 (!) 16     Resp --      Temp 06/17/20 1052 97.8 F (36.6 C)     Temp Source 06/17/20 1052 Oral     SpO2 06/17/20 1052 99 %     Weight 06/17/20 1052 160 lb 0.9 oz (72.6 kg)     Height 06/17/20 1052 5\' 5"  (1.651 m)  Head Circumference --      Peak Flow --      Pain Score 06/17/20 1051 10     Pain Loc --      Pain Edu? --      Excl. in GC? --    Constitutional: Alert and oriented. Well appearing and in no acute distress. Mouth/Throat: Mucous membranes are moist.  Oropharynx non-erythematous. Neck: No stridor.  cervical spine tenderness to palpation at C4-5 Hematological/Lymphatic/Immunilogical: No cervical lymphadenopathy. Cardiovascular: Normal rate, regular rhythm. Grossly normal heart sounds.  Good peripheral circulation. Respiratory: Normal respiratory effort.  No retractions. Lungs CTAB. Musculoskeletal: No  obvious deformity to the cervical spine or the left shoulder.   Neurologic:  Normal speech and language. No gross focal neurologic deficits are appreciated. No gait instability. Skin:  Skin is warm, dry and intact.  No abrasion or ecchymosis. Psychiatric: Mood and affect are normal. Speech and behavior are normal.  ____________________________________________   LABS (all labs ordered are listed, but only abnormal results are displayed)  Labs Reviewed - No data to display ____________________________________________  EKG   ____________________________________________  RADIOLOGY  ED MD interpretation:    Official radiology report(s): DG Cervical Spine 2-3 Views  Result Date: 06/17/2020 CLINICAL DATA:  Upper back, neck and left arm pain for 5 days. Injury wrestling. EXAM: CERVICAL SPINE - 2-3 VIEW COMPARISON:  None. FINDINGS: The prevertebral soft tissues are normal. The alignment is anatomic through T1. There is no evidence of acute fracture or traumatic subluxation. The disc spaces appear preserved. IMPRESSION: Negative three-view cervical spine radiographs. Electronically Signed   By: Carey Bullocks M.D.   On: 06/17/2020 12:07   DG Shoulder Left  Result Date: 06/17/2020 CLINICAL DATA:  Fall 4 days ago.  Pain EXAM: LEFT SHOULDER - 2+ VIEW COMPARISON:  None. FINDINGS: There is no evidence of fracture or dislocation. There is no evidence of arthropathy or other focal bone abnormality. Soft tissues are unremarkable. IMPRESSION: Negative. Electronically Signed   By: Charlett Nose M.D.   On: 06/17/2020 12:06    ____________________________________________   PROCEDURES  Procedure(s) performed (including Critical Care):  Procedures   ____________________________________________   INITIAL IMPRESSION / ASSESSMENT AND PLAN / ED COURSE  As part of my medical decision making, I reviewed the following data within the electronic MEDICAL RECORD NUMBER     Patient presents with neck and  left shoulder pain secondary to recent incident 4 days ago.  Discussed no acute findings on x-ray of cervical spine and the left shoulder.  Patient complaint physical exam is consistent with muscle skeletal pain and cervical strain.  Patient given discharge care instruction advised take medication as directed.  Patient [facility]    Zachary Soto was evaluated in Emergency Department on 06/17/2020 for the symptoms described in the history of present illness. He was evaluated in the context of the global COVID-19 pandemic, which necessitated consideration that the patient might be at risk for infection with the SARS-CoV-2 virus that causes COVID-19. Institutional protocols and algorithms that pertain to the evaluation of patients at risk for COVID-19 are in a state of rapid change based on information released by regulatory bodies including the CDC and federal and state organizations. These policies and algorithms were followed during the patient's care in the ED.       ____________________________________________   FINAL CLINICAL IMPRESSION(S) / ED DIAGNOSES  Final diagnoses:  Strain of neck muscle, initial encounter  Left arm pain  ED Discharge Orders         Ordered    naproxen (NAPROSYN) 500 MG tablet  2 times daily with meals     Discontinue  Reprint     06/17/20 1226    cyclobenzaprine (FLEXERIL) 10 MG tablet  3 times daily PRN     Discontinue  Reprint     06/17/20 1226           Note:  This document was prepared using Dragon voice recognition software and may include unintentional dictation errors.    Joni Reining, PA-C 06/17/20 1242    Shaune Pollack, MD 06/18/20 [phone]

## 2020-06-19 ENCOUNTER — Telehealth: Payer: Self-pay | Admitting: General Practice

## 2020-06-19 NOTE — Telephone Encounter (Signed)
Contacted individual to confirm that he has Medicare and informed him of where he can use the insurance.

## 2020-08-24 ENCOUNTER — Encounter (HOSPITAL_COMMUNITY): Payer: Self-pay | Admitting: Emergency Medicine

## 2020-08-24 ENCOUNTER — Other Ambulatory Visit: Payer: Self-pay

## 2020-08-24 ENCOUNTER — Emergency Department (HOSPITAL_COMMUNITY)
Admission: EM | Admit: 2020-08-24 | Discharge: 2020-08-24 | Disposition: A | Discharge status: Home / Self Care | Payer: Medicare Other | Attending: Emergency Medicine | Admitting: Emergency Medicine

## 2020-08-24 DIAGNOSIS — H9201 Otalgia, right ear: Secondary | ICD-10-CM | POA: Diagnosis present

## 2020-08-24 DIAGNOSIS — H6691 Otitis media, unspecified, right ear: Secondary | ICD-10-CM | POA: Diagnosis not present

## 2020-08-24 DIAGNOSIS — H60501 Unspecified acute noninfective otitis externa, right ear: Secondary | ICD-10-CM

## 2020-08-24 DIAGNOSIS — R519 Headache, unspecified: Secondary | ICD-10-CM | POA: Diagnosis not present

## 2020-08-24 DIAGNOSIS — Z87891 Personal history of nicotine dependence: Secondary | ICD-10-CM | POA: Insufficient documentation

## 2020-08-24 DIAGNOSIS — H6690 Otitis media, unspecified, unspecified ear: Secondary | ICD-10-CM

## 2020-08-24 MED ORDER — AMOXICILLIN 500 MG PO CAPS: 500.0000 mg | ORAL_CAPSULE | Freq: Three times a day (TID) | ORAL | 0 refills | Status: AC

## 2020-08-24 MED ORDER — CIPROFLOXACIN-DEXAMETHASONE 0.3-0.1 % OT SUSP: 4.0000 [drp] | Freq: Two times a day (BID) | OTIC | 0 refills | Status: AC

## 2020-08-24 NOTE — ED Triage Notes (Signed)
C/o bilateral ear pain with drainage x 1 week.  [place]  Also reports headache.  Tested negative for COVID within the last week.

## 2020-08-24 NOTE — Discharge Instructions (Addendum)
You were evaluated in the Emergency Department and after careful evaluation, we did not find any emergent condition requiring admission or further testing in the hospital.  Your exam/testing today was overall reassuring.  Symptoms seem to be due to inner and outer ear infections.  Please take the antibiotic pills as directed and also take the antibiotic drops in both ears as directed.  Continue Tylenol or Motrin for headaches as needed.  Please return to the Emergency Department if you experience any worsening of your condition.  Thank you for allowing [place] to be a part of your care.

## 2020-08-24 NOTE — ED Provider Notes (Signed)
[facility]PT [facility] Emergency Department Provider Note MRN:  [id number]  Arrival date & time: 08/24/20     Chief Complaint   Otalgia and Headache   History of Present Illness   Zachary Soto is a 19 y.o. year-old male with no pertinent past medical history presenting to the ED with chief complaint of ear pain.  1 week of ear pain, right greater than left, seems to [place].  Has noted drainage from the right ear.  Endorsing pressure-like frontal headaches since the ear pain began.  Denies fever, no neck pain, no chest pain, no shortness of breath, no other complaints.  Review of Systems  A complete 10 system review of systems was obtained and all systems are negative except as noted in the HPI and PMH.   Patient's Health History    Past Medical History:  Diagnosis Date  . Back pain   . Scoliosis     Past Surgical History:  Procedure Laterality Date  . CLEFT PALATE REPAIR    . TONSILLECTOMY      No family history on file.  Social History   Socioeconomic History  . Marital status: Single    Spouse name: Not on file  . Number of children: Not on file  . Years of education: Not on file  . Highest education level: Not on file  Occupational History  . Not on file  Tobacco Use  . Smoking status: Former Games developer  . Smokeless tobacco: Never Used  Vaping Use  . Vaping Use: Never used  Substance and Sexual Activity  . Alcohol use: Yes    Comment: "blunts this morning"  . Drug use: Yes    Types: Marijuana  . Sexual activity: Not on file  Other Topics Concern  . Not on file  Social History Narrative  . Not on file   Social Determinants of Health   Financial Resource Strain:   . Difficulty of Paying Living Expenses: Not on file  Food Insecurity:   . Worried About Programme researcher, broadcasting/film/video in the Last Year: Not on file  . Ran Out of Food in the Last Year: Not on file  Transportation Needs:   . Lack of  Transportation (Medical): Not on file  . Lack of Transportation (Non-Medical): Not on file  Physical Activity:   . Days of Exercise per Week: Not on file  . Minutes of Exercise per Session: Not on file  Stress:   . Feeling of Stress : Not on file  Social Connections:   . Frequency of Communication with Friends and Family: Not on file  . Frequency of Social Gatherings with Friends and Family: Not on file  . Attends Religious Services: Not on file  . Active Member of Clubs or Organizations: Not on file  . Attends Banker Meetings: Not on file  . Marital Status: Not on file  Intimate Partner Violence:   . Fear of Current or Ex-Partner: Not on file  . Emotionally Abused: Not on file  . Physically Abused: Not on file  . Sexually Abused: Not on file     Physical Exam   Vitals:   08/24/20 1042  BP: (!) 134/93  Pulse: 83  Resp: 18  Temp: 99 F (37.2 C)  SpO2: 97%    CONSTITUTIONAL: Well-appearing, NAD NEURO:  Alert and oriented x 3, normal and symmetric strength and sensation, normal coordination, normal speech EYES:  eyes equal and reactive ENT/NECK:  no LAD, no JVD; erythematous left TM with effusion, erythematous right external ear canal with purulent drainage CARDIO: Regular rate, well-perfused, normal S1 and S2 PULM:  CTAB no wheezing or rhonchi GI/GU:  normal bowel sounds, non-distended, non-tender MSK/SPINE:  No gross deformities, no edema SKIN:  no rash, atraumatic PSYCH:  Appropriate speech and behavior  *Additional and/or pertinent findings included in MDM below  Diagnostic and Interventional Summary    EKG Interpretation  Date/Time:    Ventricular Rate:    PR Interval:    QRS Duration:   QT Interval:    QTC Calculation:   R Axis:     Text Interpretation:        Labs Reviewed - No data to display  No orders to display    Medications - No data to display   Procedures  /  Critical Care Procedures  ED Course and Medical Decision Making    I have reviewed the triage vital signs, the nursing notes, and pertinent available records from the EMR.  Listed above are laboratory and imaging tests that I personally ordered, reviewed, and interpreted and then considered in my medical decision making (see below for details).  Clinically consistent with otitis externa, exam also with some concern for otitis media, no systemic symptoms, headache seems benign and related to the underlying ear pathology, normal neurological exam, no indication for imaging, appropriate for discharge.       Elmer Sow. Pilar Plate, MD [facility] Emergency Medicine [facility] [email or web address]  Final Clinical Impressions(s) / ED Diagnoses     ICD-10-CM   1. Acute otitis media, unspecified otitis media type  H66.90   2. Acute otitis externa of right ear, unspecified type  H60.501     ED Discharge Orders         Ordered    ciprofloxacin-dexamethasone (CIPRODEX) OTIC suspension  2 times daily        08/24/20 1209    amoxicillin (AMOXIL) 500 MG capsule  3 times daily        08/24/20 1209           Discharge Instructions Discussed with and Provided to Patient:     Discharge Instructions     You were evaluated in the Emergency Department and after careful evaluation, we did not find any emergent condition requiring admission or further testing in the hospital.  Your exam/testing today was overall reassuring.  Symptoms seem to be due to inner and outer ear infections.  Please take the antibiotic pills as directed and also take the antibiotic drops in both ears as directed.  Continue Tylenol or Motrin for headaches as needed.  Please return to the Emergency Department if you experience any worsening of your condition.  Thank you for allowing [place] to be a part of your care.       Sabas Sous, MD 08/24/20 1212

## 2020-09-11 ENCOUNTER — Emergency Department (HOSPITAL_COMMUNITY)
Admission: EM | Admit: 2020-09-11 | Discharge: 2020-09-11 | Disposition: A | Discharge status: Home / Self Care | Payer: Medicare Other | Attending: Emergency Medicine | Admitting: Emergency Medicine

## 2020-09-11 ENCOUNTER — Other Ambulatory Visit: Payer: Self-pay

## 2020-09-11 ENCOUNTER — Encounter (HOSPITAL_COMMUNITY): Payer: Self-pay

## 2020-09-11 DIAGNOSIS — Z87891 Personal history of nicotine dependence: Secondary | ICD-10-CM | POA: Insufficient documentation

## 2020-09-11 DIAGNOSIS — M79605 Pain in left leg: Secondary | ICD-10-CM | POA: Diagnosis not present

## 2020-09-11 DIAGNOSIS — M79604 Pain in right leg: Secondary | ICD-10-CM | POA: Diagnosis present

## 2020-09-11 DIAGNOSIS — M6282 Rhabdomyolysis: Secondary | ICD-10-CM | POA: Insufficient documentation

## 2020-09-11 LAB — CBC WITH DIFFERENTIAL/PLATELET
Abs Immature Granulocytes: 0.03 10*3/uL (ref 0.00–0.07)
Basophils Absolute: 0 10*3/uL (ref 0.0–0.1)
Basophils Relative: 0 %
Eosinophils Absolute: 0.1 10*3/uL (ref 0.0–0.5)
Eosinophils Relative: 1 %
HCT: 45.3 % (ref 39.0–52.0)
Hemoglobin: 15.1 g/dL (ref 13.0–17.0)
Immature Granulocytes: 0 %
Lymphocytes Relative: 23 %
Lymphs Abs: 1.9 10*3/uL (ref 0.7–4.0)
MCH: 30.7 pg (ref 26.0–34.0)
MCHC: 33.3 g/dL (ref 30.0–36.0)
MCV: 92.1 fL (ref 80.0–100.0)
Monocytes Absolute: 0.7 10*3/uL (ref 0.1–1.0)
Monocytes Relative: 8 %
Neutro Abs: 5.8 10*3/uL (ref 1.7–7.7)
Neutrophils Relative %: 68 %
Platelets: 317 10*3/uL (ref 150–400)
RBC: 4.92 MIL/uL (ref 4.22–5.81)
RDW: 11.7 % (ref 11.5–15.5)
WBC: 8.5 10*3/uL (ref 4.0–10.5)
nRBC: 0 % (ref 0.0–0.2)

## 2020-09-11 LAB — BASIC METABOLIC PANEL
Anion gap: 9 (ref 5–15)
BUN: <5 mg/dL — ABNORMAL LOW (ref 6–20)
CO2: 29 mmol/L (ref 22–32)
Calcium: 9.9 mg/dL (ref 8.9–10.3)
Chloride: 100 mmol/L (ref 98–111)
Creatinine, Ser: 0.86 mg/dL (ref 0.61–1.24)
GFR calc non Af Amer: >60 mL/min (ref >60)
Glucose, Bld: 97 mg/dL (ref 70–99)
Potassium: 4 mmol/L (ref 3.5–5.1)
Sodium: 138 mmol/L (ref 135–145)

## 2020-09-11 LAB — CK: Total CK: 5699 U/L — ABNORMAL HIGH (ref 49–397)

## 2020-09-11 MED ORDER — ACETAMINOPHEN 500 MG PO TABS
1000.0000 mg | ORAL_TABLET | Freq: Once | ORAL | Status: AC
  Administered 2020-09-11: 1000 mg via ORAL
  Filled 2020-09-11: qty 2

## 2020-09-11 MED ORDER — SODIUM CHLORIDE 0.9 % IV BOLUS
2000.0000 mL | Freq: Once | INTRAVENOUS | Status: AC
  Administered 2020-09-11: 2000 mL via INTRAVENOUS

## 2020-09-11 NOTE — ED Notes (Signed)
Pt refusing to have IV access and refusing to have the fluids given. ED provider notified.

## 2020-09-11 NOTE — Discharge Instructions (Signed)
The pain in your legs is due to rhabdomyolysis, this is a breakdown of muscle that causes pain and swelling.  Please avoid running for any strenuous activity for the next several days.  Make sure you are drinking plenty of water, at least 3 L of water a day.  Avoid medications like Advil, ibuprofen, aspirin or Aleve, you can use Tylenol as needed for pain.  Avoid marijuana or other drugs that can worsen this.  If you develop worsening leg pain or swelling, change in color of you legs, vomiting, dark urine or any other new or concerning symptoms please return for reevaluation.

## 2020-09-11 NOTE — ED Notes (Signed)
Pt states he dose not want any blood work at all.

## 2020-09-11 NOTE — ED Provider Notes (Signed)
[facility] EMERGENCY DEPARTMENT Provider Note   CSN: [id number] Arrival date & time: 09/11/20  1714     History Chief Complaint  Patient presents with  . Leg Pain    Zachary Soto Zachary Soto is a 19 y.o. male.  Zachary Soto is a 19 y.o. male with a history of back pain and scoliosis, who presents to the emergency department for evaluation of bilateral leg pain.  He reports that about a week ago he was going for a jog like he typically does daily when he felt like he took some missteps, ever since then his legs have been hurting.  He reports pain is gotten worse in the last 3 days.  He felt like a few days ago he noticed some bruising over the calf of his right lower leg and states pain is worse in this leg.  It goes all the way up the legs and he thinks he has noticed some swelling bilaterally.  Denies any numbness tingling or weakness.  Other than jogging, no trauma or injury to the legs.  No chest pain or shortness of breath.  Patient has been taking NSAIDs without improvement.  Has not tried anything else to treat pain.  Denies any associated fevers or chills.  No other aggravating or alleviating factors.        Past Medical History:  Diagnosis Date  . Back pain   . Scoliosis     Patient Active Problem List   Diagnosis Date Noted  . Depression 07/06/2016  . 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 CLEFT PALATE REPAIR    . TONSILLECTOMY         No family history on file.  Social History   Tobacco Use  . Smoking status: Former Games developer  . Smokeless tobacco: Never Used  Vaping Use  . Vaping Use: Never used  Substance Use Topics  . Alcohol use: Yes    Comment: "blunts this morning"  . Drug use: Yes    Types: Marijuana    Home Medications Prior to Admission medications   Medication Sig Start Date End Date Taking? Authorizing Provider  cyclobenzaprine (FLEXERIL) 10 MG tablet Take 1 tablet  (10 mg total) by mouth 3 (three) times daily as needed. 06/17/20   Joni Reining, PA-C  HYDROcodone-acetaminophen (NORCO/VICODIN) 5-325 MG tablet Take 1 tablet by mouth every 6 (six) hours as needed for moderate pain. 11/07/19   Fisher, Roselyn Bering, PA-C  metoCLOPramide (REGLAN) 10 MG tablet Take 1 tablet (10 mg total) by mouth every 6 (six) hours as needed for nausea (nausea/headache). 02/06/19   Charlynne Pander, MD  naproxen (NAPROSYN) 500 MG tablet Take 1 tablet (500 mg total) by mouth 2 (two) times daily with a meal. 06/17/20   Joni Reining, PA-C  predniSONE (DELTASONE) 10 MG tablet Take 3 tablets (30 mg total) by mouth daily with breakfast. 11/07/19   Sherrie Mustache Roselyn Bering, PA-C    Allergies    Patient has no known allergies.  Review of Systems   Review of Systems  Constitutional: Negative for chills and fever.  Respiratory: Negative for cough and shortness of breath.   Cardiovascular: Negative for chest pain.  Gastrointestinal: Negative for abdominal pain, nausea and vomiting.  Genitourinary: Negative for dysuria and hematuria.  Musculoskeletal: Positive for arthralgias and myalgias.  Skin: Negative for color change and rash.  Neurological: Negative for weakness and numbness.  All other systems reviewed and  are negative.   Physical Exam Updated Vital Signs BP (!) 145/107 (BP Location: Left Arm)   Pulse 70   Temp 98.8 F (37.1 C) (Oral)   Resp 18   Ht 5\' 6"  (1.676 m)   Wt 68 kg   SpO2 100%   BMI 24.21 kg/m   Physical Exam Vitals and nursing note reviewed.  Constitutional:      General: He is not in acute distress.    Appearance: Normal appearance. He is well-developed and normal weight. He is not ill-appearing or diaphoretic.     Comments: Well-appearing and in no distress  HENT:     Head: Normocephalic and atraumatic.     Mouth/Throat:     Mouth: Mucous membranes are moist.     Pharynx: Oropharynx is clear.  Eyes:     General:        Right eye: No discharge.         Left eye: No discharge.  Cardiovascular:     Rate and Rhythm: Normal rate and regular rhythm.     Heart sounds: Normal heart sounds.  Pulmonary:     Effort: Pulmonary effort is normal. No respiratory distress.     Breath sounds: Normal breath sounds.     Comments: Respirations equal and unlabored, patient able to speak in full sentences, lungs clear to auscultation bilaterally Abdominal:     Tenderness: There is no abdominal tenderness.  Musculoskeletal:        General: Tenderness present.     Cervical back: Neck supple.     Comments: Minimal swelling if any noted in bilateral extremities, no discoloration, bruising, erythema or warmth noted, there is no focal bony tenderness or deformity, and no localized swelling over the joints.  Patient is able to range both lower extremities without difficulty, he has tenderness in bilateral lower extremities that is out of proportion to exam. DP and PT pulses 2+ and legs are warm and well perfused with good cap refill, normal sensation and strength  Skin:    General: Skin is warm and dry.  Neurological:     Mental Status: He is alert and oriented to person, place, and time.     Coordination: Coordination normal.  Psychiatric:        Mood and Affect: Mood normal.        Behavior: Behavior normal.     ED Results / Procedures / Treatments   Labs (all labs ordered are listed, but only abnormal results are displayed) Labs Reviewed  BASIC METABOLIC PANEL - Abnormal; Notable for the following components:      Result Value   BUN <5 (*)    All other components within normal limits  CK - Abnormal; Notable for the following components:   Total CK 5,699 (*)    All other components within normal limits  CBC WITH DIFFERENTIAL/PLATELET  URINALYSIS, ROUTINE W REFLEX MICROSCOPIC    EKG None  Radiology No results found.  Procedures Procedures (including critical care time)  Medications Ordered in ED Medications  acetaminophen (TYLENOL) tablet  1,000 mg (1,000 mg Oral Given 09/11/20 2118)  sodium chloride 0.9 % bolus 2,000 mL (2,000 mLs Intravenous New Bag/Given 09/11/20 2301)    ED Course  I have reviewed the triage vital signs and the nursing notes.  Pertinent labs & imaging results that were available during my care of the patient were reviewed by me and considered in my medical decision making (see chart for details).    MDM Rules/Calculators/A&P  Patient initially refused lab work, but I went and discussed with him the need to check and my concern for rhabdomyolysis given his severe diffuse bilateral leg pain, he was agreeable.  I have independently ordered, reviewed and interpreted all labs: CBC: No leukocytosis, normal hemoglobin BMP: No electrolyte derangements and normal renal function CK: Elevated at 5699  Patient with elevated CK likely suggestive of rhabdomyolysis, he has no signs of compartment syndrome in bilateral lower extremities on exam, I have very low suspicion for DVT.  Will give 2 L of IV fluids but given that he has normal renal function, no dark urine, do not think he will require admission for this.   Notified by nursing staff that initially patient refused IV fluids, but after I spoke to him and discuss his options and plan for discharge after fluids he is agreeable.  After IV fluid bolus will discharge home.  I have given patient strict instructions to avoid strenuous physical activity or running, to hydrate aggressively and drink plenty of water and to avoid NSAIDs.  Encouraged Tylenol as needed for pain.  Stressed the importance of returning if his leg pain is worsening, he develops vomiting, dark urine or any other new or concerning symptoms.  Final Clinical Impression(s) / ED Diagnoses Final diagnoses:  Non-traumatic rhabdomyolysis  Bilateral leg pain    Rx / DC Orders ED Discharge Orders    None       Legrand Rams 09/12/20 0008    Gwyneth Sprout,  MD 09/12/20 2312

## 2020-09-11 NOTE — ED Notes (Signed)
Pt refusing to have labs done.

## 2020-09-11 NOTE — ED Triage Notes (Signed)
Pt reports bilateral leg pain for 3 days, denies injury or trauma, right leg more swollen than left. Pt ambulatory

## 2021-01-16 ENCOUNTER — Observation Stay
Admission: EM | Admit: 2021-01-16 | Discharge: 2021-01-17 | Disposition: A | Discharge status: Home / Self Care | Payer: Medicare Other | Attending: Internal Medicine | Admitting: Internal Medicine

## 2021-01-16 ENCOUNTER — Emergency Department: Payer: Medicare Other

## 2021-01-16 ENCOUNTER — Other Ambulatory Visit: Payer: Self-pay

## 2021-01-16 DIAGNOSIS — T402X4A Poisoning by other opioids, undetermined, initial encounter: Secondary | ICD-10-CM | POA: Diagnosis not present

## 2021-01-16 DIAGNOSIS — T40604A Poisoning by unspecified narcotics, undetermined, initial encounter: Secondary | ICD-10-CM

## 2021-01-16 DIAGNOSIS — R000 Tachycardia, unspecified: Secondary | ICD-10-CM | POA: Diagnosis not present

## 2021-01-16 DIAGNOSIS — J9601 Acute respiratory failure with hypoxia: Secondary | ICD-10-CM | POA: Insufficient documentation

## 2021-01-16 DIAGNOSIS — G929 Unspecified toxic encephalopathy: Secondary | ICD-10-CM | POA: Insufficient documentation

## 2021-01-16 DIAGNOSIS — K7200 Acute and subacute hepatic failure without coma: Secondary | ICD-10-CM | POA: Insufficient documentation

## 2021-01-16 DIAGNOSIS — T50901A Poisoning by unspecified drugs, medicaments and biological substances, accidental (unintentional), initial encounter: Secondary | ICD-10-CM | POA: Diagnosis not present

## 2021-01-16 DIAGNOSIS — J690 Pneumonitis due to inhalation of food and vomit: Secondary | ICD-10-CM | POA: Diagnosis not present

## 2021-01-16 DIAGNOSIS — T507X4A Poisoning by analeptics and opioid receptor antagonists, undetermined, initial encounter: Secondary | ICD-10-CM | POA: Insufficient documentation

## 2021-01-16 DIAGNOSIS — R778 Other specified abnormalities of plasma proteins: Secondary | ICD-10-CM | POA: Insufficient documentation

## 2021-01-16 DIAGNOSIS — Z7952 Long term (current) use of systemic steroids: Secondary | ICD-10-CM | POA: Insufficient documentation

## 2021-01-16 DIAGNOSIS — Z20822 Contact with and (suspected) exposure to covid-19: Secondary | ICD-10-CM | POA: Insufficient documentation

## 2021-01-16 DIAGNOSIS — Z87891 Personal history of nicotine dependence: Secondary | ICD-10-CM | POA: Diagnosis not present

## 2021-01-16 DIAGNOSIS — M419 Scoliosis, unspecified: Secondary | ICD-10-CM | POA: Diagnosis not present

## 2021-01-16 LAB — COMPREHENSIVE METABOLIC PANEL
ALT: 57 U/L — ABNORMAL HIGH (ref 0–44)
AST: 57 U/L — ABNORMAL HIGH (ref 15–41)
Albumin: 4.7 g/dL (ref 3.5–5.0)
Alkaline Phosphatase: 98 U/L (ref 38–126)
Anion gap: 16 — ABNORMAL HIGH (ref 5–15)
BUN: 13 mg/dL (ref 6–20)
CO2: 19 mmol/L — ABNORMAL LOW (ref 22–32)
Calcium: 8.9 mg/dL (ref 8.9–10.3)
Chloride: 103 mmol/L (ref 98–111)
Creatinine, Ser: 1.07 mg/dL (ref 0.61–1.24)
GFR, Estimated: >60 mL/min (ref >60)
Glucose, Bld: 108 mg/dL — ABNORMAL HIGH (ref 70–99)
Potassium: 3.5 mmol/L (ref 3.5–5.1)
Sodium: 138 mmol/L (ref 135–145)
Total Bilirubin: 0.6 mg/dL (ref 0.3–1.2)
Total Protein: 7.7 g/dL (ref 6.5–8.1)

## 2021-01-16 LAB — CBC
HCT: 49 % (ref 39.0–52.0)
Hemoglobin: 16.6 g/dL (ref 13.0–17.0)
MCH: 30.5 pg (ref 26.0–34.0)
MCHC: 33.9 g/dL (ref 30.0–36.0)
MCV: 90.1 fL (ref 80.0–100.0)
Platelets: 259 10*3/uL (ref 150–400)
RBC: 5.44 MIL/uL (ref 4.22–5.81)
RDW: 12.6 % (ref 11.5–15.5)
WBC: 17.8 10*3/uL — ABNORMAL HIGH (ref 4.0–10.5)
nRBC: 0 % (ref 0.0–0.2)

## 2021-01-16 LAB — CBC WITH DIFFERENTIAL/PLATELET
Abs Immature Granulocytes: 0.21 10*3/uL — ABNORMAL HIGH (ref 0.00–0.07)
Basophils Absolute: 0.1 10*3/uL (ref 0.0–0.1)
Basophils Relative: 1 %
Eosinophils Absolute: 0 10*3/uL (ref 0.0–0.5)
Eosinophils Relative: 0 %
HCT: 53.4 % — ABNORMAL HIGH (ref 39.0–52.0)
Hemoglobin: 17.9 g/dL — ABNORMAL HIGH (ref 13.0–17.0)
Immature Granulocytes: 1 %
Lymphocytes Relative: 9 %
Lymphs Abs: 1.5 10*3/uL (ref 0.7–4.0)
MCH: 30.2 pg (ref 26.0–34.0)
MCHC: 33.5 g/dL (ref 30.0–36.0)
MCV: 90.2 fL (ref 80.0–100.0)
Monocytes Absolute: 0.6 10*3/uL (ref 0.1–1.0)
Monocytes Relative: 4 %
Neutro Abs: 14.9 10*3/uL — ABNORMAL HIGH (ref 1.7–7.7)
Neutrophils Relative %: 85 %
Platelets: 339 10*3/uL (ref 150–400)
RBC: 5.92 MIL/uL — ABNORMAL HIGH (ref 4.22–5.81)
RDW: 12.5 % (ref 11.5–15.5)
WBC: 17.3 10*3/uL — ABNORMAL HIGH (ref 4.0–10.5)
nRBC: 0 % (ref 0.0–0.2)

## 2021-01-16 LAB — TROPONIN I (HIGH SENSITIVITY)
Troponin I (High Sensitivity): 159 ng/L (ref <18)
Troponin I (High Sensitivity): 274 ng/L (ref <18)
Troponin I (High Sensitivity): 160 ng/L (ref <18)

## 2021-01-16 LAB — ACETAMINOPHEN LEVEL: Acetaminophen (Tylenol), Serum: <10 ug/mL — ABNORMAL LOW (ref 10–30)

## 2021-01-16 LAB — PROTIME-INR
INR: 1 (ref 0.8–1.2)
Prothrombin Time: 12.4 seconds (ref 11.4–15.2)

## 2021-01-16 LAB — GLUCOSE, CAPILLARY: Glucose-Capillary: 118 mg/dL — ABNORMAL HIGH (ref 70–99)

## 2021-01-16 LAB — URINE DRUG SCREEN, QUALITATIVE (ARMC ONLY)
Amphetamines, Ur Screen: NOT DETECTED
Barbiturates, Ur Screen: NOT DETECTED
Benzodiazepine, Ur Scrn: NOT DETECTED
Cannabinoid 50 Ng, Ur ~~LOC~~: POSITIVE — AB
Cocaine Metabolite,Ur ~~LOC~~: NOT DETECTED
MDMA (Ecstasy)Ur Screen: NOT DETECTED
Methadone Scn, Ur: NOT DETECTED
Opiate, Ur Screen: NOT DETECTED
Phencyclidine (PCP) Ur S: NOT DETECTED
Tricyclic, Ur Screen: NOT DETECTED

## 2021-01-16 LAB — PHOSPHORUS: Phosphorus: 5.1 mg/dL — ABNORMAL HIGH (ref 2.5–4.6)

## 2021-01-16 LAB — CREATININE, SERUM
Creatinine, Ser: 1.09 mg/dL (ref 0.61–1.24)
GFR, Estimated: >60 mL/min (ref >60)

## 2021-01-16 LAB — RESP PANEL BY RT-PCR (FLU A&B, COVID) ARPGX2
Influenza A by PCR: NEGATIVE
Influenza B by PCR: NEGATIVE
SARS Coronavirus 2 by RT PCR: NEGATIVE

## 2021-01-16 LAB — APTT: aPTT: 27 seconds (ref 24–36)

## 2021-01-16 LAB — SALICYLATE LEVEL: Salicylate Lvl: <7 mg/dL — ABNORMAL LOW (ref 7.0–30.0)

## 2021-01-16 LAB — PROCALCITONIN: Procalcitonin: 12.2 ng/mL

## 2021-01-16 LAB — HIV ANTIBODY (ROUTINE TESTING W REFLEX): HIV Screen 4th Generation wRfx: NONREACTIVE

## 2021-01-16 LAB — ETHANOL: Alcohol, Ethyl (B): <10 mg/dL (ref <10)

## 2021-01-16 LAB — MAGNESIUM: Magnesium: 2.3 mg/dL (ref 1.7–2.4)

## 2021-01-16 MED ORDER — GUAIFENESIN-DM 100-10 MG/5ML PO SYRP
5.0000 mL | ORAL_SOLUTION | ORAL | Status: DC | PRN
  Administered 2021-01-17: 5 mL via ORAL
  Filled 2021-01-16: qty 5

## 2021-01-16 MED ORDER — SODIUM CHLORIDE 0.9 % IV SOLN
3.0000 g | Freq: Four times a day (QID) | INTRAVENOUS | Status: DC
  Administered 2021-01-16 – 2021-01-17 (×5): 3 g via INTRAVENOUS
  Filled 2021-01-16 (×7): qty 8
  Filled 2021-01-16: qty 3
  Filled 2021-01-16: qty 8

## 2021-01-16 MED ORDER — ONDANSETRON HCL 4 MG/2ML IJ SOLN: 4.0000 mg | Freq: Four times a day (QID) | INTRAMUSCULAR | Status: DC | PRN

## 2021-01-16 MED ORDER — ENOXAPARIN SODIUM 40 MG/0.4ML ~~LOC~~ SOLN: 40.0000 mg | SUBCUTANEOUS | Status: DC

## 2021-01-16 MED ORDER — SODIUM CHLORIDE 0.9 % IV SOLN
3.0000 g | Freq: Once | INTRAVENOUS | Status: AC
  Administered 2021-01-16: 3 g via INTRAVENOUS
  Filled 2021-01-16: qty 8

## 2021-01-16 MED ORDER — DOCUSATE SODIUM 100 MG PO CAPS: 100.0000 mg | ORAL_CAPSULE | Freq: Two times a day (BID) | ORAL | Status: DC | PRN

## 2021-01-16 MED ORDER — NALOXONE HCL 4 MG/10ML IJ SOLN
4.0000 mg/h | INTRAVENOUS | Status: DC
  Administered 2021-01-16 (×8): 4 mg/h via INTRAVENOUS
  Filled 2021-01-16 (×11): qty 10

## 2021-01-16 MED ORDER — ONDANSETRON HCL 4 MG/2ML IJ SOLN
INTRAMUSCULAR | Status: AC
  Filled 2021-01-16: qty 2

## 2021-01-16 MED ORDER — ONDANSETRON HCL 4 MG/2ML IJ SOLN
4.0000 mg | Freq: Once | INTRAMUSCULAR | Status: AC
  Administered 2021-01-16: 4 mg via INTRAVENOUS

## 2021-01-16 MED ORDER — CHLORHEXIDINE GLUCONATE CLOTH 2 % EX PADS: 6.0000 | MEDICATED_PAD | Freq: Every day | CUTANEOUS | Status: DC

## 2021-01-16 MED ORDER — MELATONIN 5 MG PO TABS
5.0000 mg | ORAL_TABLET | Freq: Every day | ORAL | Status: DC
  Administered 2021-01-16: 5 mg via ORAL
  Filled 2021-01-16: qty 1

## 2021-01-16 MED ORDER — IPRATROPIUM-ALBUTEROL 0.5-2.5 (3) MG/3ML IN SOLN: 3.0000 mL | Freq: Four times a day (QID) | RESPIRATORY_TRACT | Status: DC | PRN

## 2021-01-16 MED ORDER — POLYETHYLENE GLYCOL 3350 17 G PO PACK: 17.0000 g | PACK | Freq: Every day | ORAL | Status: DC | PRN

## 2021-01-16 NOTE — ED Notes (Addendum)
Date and time results received: 01/16/21 0142 (use smartphrase ".now" to insert current time)  Test: Troponin Critical Value: 159  Name of Provider Notified: Don Perking  Orders Received? Or Actions Taken?: Provider notified

## 2021-01-16 NOTE — ED Notes (Addendum)
Pt agreed to let RN obtain covid swab. Water given to pt with permission from provider

## 2021-01-16 NOTE — Progress Notes (Signed)
Pharmacy Antibiotic Note  Zachary Soto is a 20 y.o. male admitted on 01/16/2021 due to overdosing requiring narcan with subsequent aspiration leading to acute hypoxic respiratory failure requiring HHFNC.  Pharmacy has been consulted for Unasyn dosing for possible development of aspiration pneumonia. CXR concerning for multifocal pneumonia  Plan: Unasyn 3 gm q6hr for 5 days based on indication and SCr. Will continue to monitor and continue tx > 5 days if needed. Continue to follow SCr and adjust dose if warranted.  O2 Sat:  84%-100% Temp (24hrs), Avg:97.5 F (36.4 C), Min:97.5 F (36.4 C), Max:97.5 F (36.4 C)  Recent Labs  Lab 01/16/21 0034  WBC 17.3*  CREATININE 1.07    CrCl cannot be calculated (Unknown ideal weight.).    No Known Allergies  Antimicrobials this admission: 02/10 Unasyn >>   Microbiology results: No lab cx ordered at this time.  Thank you for allowing pharmacy to be a part of this patient's care.  Otelia Sergeant, PharmD, [facility] 01/16/2021 4:23 AM

## 2021-01-16 NOTE — Progress Notes (Signed)
Patient ID: Zachary Soto, male   DOB: [birth year], 20 y.o.   MRN: [id number] received signout via secure chat from [facility] CM. Patient overdosed on Percocet. Aspiration pneumonia.  TRH will pick up February 11. Was on 100%HHFNC-- now on 5 LNC

## 2021-01-16 NOTE — ED Notes (Signed)
Pt ripped off HFNC and non-rebreather. RN placed HFNC back on and ED provider stated we dont need to put the non-rebreather back. RN attempted for a 20G to the right hand, but was unable to get. Catheter intact, bleeding controlled. Pt refusing covid-swab

## 2021-01-16 NOTE — ED Triage Notes (Signed)
Pt BIB EMS for his second overdose today. EMS states as per family, pt takes opiode pills. EMS reports pt received 6mg  intranasal narcan.  Pts room air saturation 70%.  Pt keeps yelling "I need water."

## 2021-01-16 NOTE — ED Provider Notes (Signed)
[facility] Emergency Department Provider Note  ____________________________________________  Time seen: Approximately 1:16 AM  I have reviewed the triage vital signs and the nursing notes.   HISTORY  Chief Complaint Drug Overdose  Level 5 caveat:  Portions of the history and physical were unable to be obtained due to intoxication   HPI Zachary Soto is a 20 y.o. male with a history of depression and prior overdose who presents for evaluation after an overdose.  According to EMS they responded earlier this evening for an overdose and patient received Narcan.  He then refused transport.  EMS was called again for repeat overdose.  This time patient received 6 mg of Narcan.  Patient with agonal respirations and cyanotic on arrival but had pulses.  No CPR was done.  Patient started vomiting after receiving Narcan.  Patient arrives actively vomiting and hypoxic to the 70s on room air.  Initially patient not cooperative but alert and oriented x3.   Patient initially adamantly denied using any drugs.  Later on he reports taking 1 Percocet. He is complaining fo shortness of breath.  Past Medical History:  Diagnosis Date  . Back pain   . Scoliosis     Patient Active Problem List   Diagnosis Date Noted  . Depression 07/06/2016  . 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 CLEFT PALATE REPAIR    . TONSILLECTOMY      Prior to Admission medications   Medication Sig Start Date End Date Taking? Authorizing Provider  cyclobenzaprine (FLEXERIL) 10 MG tablet Take 1 tablet (10 mg total) by mouth 3 (three) times daily as needed. 06/17/20   Joni Reining, PA-C  HYDROcodone-acetaminophen (NORCO/VICODIN) 5-325 MG tablet Take 1 tablet by mouth every 6 (six) hours as needed for moderate pain. 11/07/19   Fisher, Roselyn Bering, PA-C  metoCLOPramide (REGLAN) 10 MG tablet Take 1 tablet (10 mg total) by mouth every 6 (six) hours as needed  for nausea (nausea/headache). 02/06/19   Charlynne Pander, MD  naproxen (NAPROSYN) 500 MG tablet Take 1 tablet (500 mg total) by mouth 2 (two) times daily with a meal. 06/17/20   Joni Reining, PA-C  predniSONE (DELTASONE) 10 MG tablet Take 3 tablets (30 mg total) by mouth daily with breakfast. 11/07/19   Sherrie Mustache Roselyn Bering, PA-C    Allergies Patient has no known allergies.  No family history on file.  Social History Social History   Tobacco Use  . Smoking status: Former Games developer  . Smokeless tobacco: Never Used  Vaping Use  . Vaping Use: Never used  Substance Use Topics  . Alcohol use: Yes    Comment: "blunts this morning"  . Drug use: Yes    Types: Marijuana    Review of Systems  Constitutional: Negative for fever. Eyes: Negative for visual changes. ENT: Negative for sore throat. Neck: No neck pain  Cardiovascular: Negative for chest pain. Respiratory:+ shortness of breath. Gastrointestinal: Negative for abdominal pain or diarrhea. + vomiting Genitourinary: Negative for dysuria. Musculoskeletal: Negative for back pain. Skin: Negative for rash. Neurological: Negative for headaches, weakness or numbness. Psych: No SI or HI  ____________________________________________   PHYSICAL EXAM:  VITAL SIGNS: ED Triage Vitals  Enc Vitals Group     BP 01/16/21 0024 138/86     Pulse Rate 01/16/21 0024 (!) 115     Resp 01/16/21 0024 (!) 26     Temp 01/16/21 0052 (!) 97.5 F (36.4 C)  Temp Source 01/16/21 0052 Axillary     SpO2 01/16/21 0024 (!) 84 %     Weight --      Height --      Head Circumference --      Peak Flow --      Pain Score 01/16/21 0025 0     Pain Loc --      Pain Edu? --      Excl. in GC? --     Constitutional: Alert and oriented x 3 with moments of decreased mentation but easily arousable. Actively vomiting.  HEENT:      Head: Normocephalic and atraumatic.         Eyes: Conjunctivae are normal. Sclera is non-icteric. Pinpoint pupils       Mouth/Throat: Mucous membranes are moist.       Neck: Supple with no signs of meningismus. Cardiovascular: Tachycardic with regular rhythm Respiratory: Increased work of breathing, tachypneic, hypoxic to 70% with good air movement, no wheezing or crackles  gastrointestinal: Soft, non tender, and non distended Musculoskeletal: No edema, cyanosis, or erythema of extremities. Neurologic: Normal speech and language. Face is symmetric. Moving all extremities. No gross focal neurologic deficits are appreciated. Skin: Skin is warm, dry and intact. No rash noted. Psychiatric: Mood and affect are normal. Speech and behavior are normal.  ____________________________________________   LABS (all labs ordered are listed, but only abnormal results are displayed)  Labs Reviewed  CBC WITH DIFFERENTIAL/PLATELET - Abnormal; Notable for the following components:      Result Value   WBC 17.3 (*)    RBC 5.92 (*)    Hemoglobin 17.9 (*)    HCT 53.4 (*)    Neutro Abs 14.9 (*)    Abs Immature Granulocytes 0.21 (*)    All other components within normal limits  RESP PANEL BY RT-PCR (FLU A&B, COVID) ARPGX2  COMPREHENSIVE METABOLIC PANEL  URINE DRUG SCREEN, QUALITATIVE ([facility] ONLY)  ETHANOL  SALICYLATE LEVEL  ACETAMINOPHEN LEVEL  TROPONIN I (HIGH SENSITIVITY)   ____________________________________________  EKG  ED ECG REPORT I, Nita Sickle, the attending physician, personally viewed and interpreted this ECG.  Sinus tachycardia, rate of 144, no ST elevations or depressions. ____________________________________________  RADIOLOGY  I have personally reviewed the images performed during this visit and I agree with the Radiologist's read.   Interpretation by Radiologist:  DG Chest Portable 1 View  Result Date: 01/16/2021 CLINICAL DATA:  Aspiration EXAM: PORTABLE CHEST 1 VIEW COMPARISON:  None. FINDINGS: The heart size and mediastinal contours are within normal limits. Multifocal ground-glass  patchy airspace opacities are seen throughout both lungs probably within the perihilar region and lower lungs. No pleural effusion. IMPRESSION: Multifocal airspace opacities throughout both lungs which could be due to multifocal pneumonia Electronically Signed   By: Jonna Clark M.D.   On: 01/16/2021 00:39     ____________________________________________   PROCEDURES  Procedure(s) performed:yes .1-3 Lead EKG Interpretation Performed by: Nita Sickle, MD Authorized by: Nita Sickle, MD     Interpretation: non-specific     ECG rate assessment: tachycardic     Rhythm: sinus tachycardia     Ectopy: none     Critical Care performed: yes  CRITICAL CARE Performed by: Nita Sickle  ?  Total critical care time: 45 min  Critical care time was exclusive of separately billable procedures and treating other patients.  Critical care was necessary to treat or prevent imminent or life-threatening deterioration.  Critical care was time spent personally by me on the following  activities: development of treatment plan with patient and/or surrogate as well as nursing, discussions with consultants, evaluation of patient's response to treatment, examination of patient, obtaining history from patient or surrogate, ordering and performing treatments and interventions, ordering and review of laboratory studies, ordering and review of radiographic studies, pulse oximetry and re-evaluation of patient's condition.  ____________________________________________   INITIAL IMPRESSION / ASSESSMENT AND PLAN / ED COURSE  20 y.o. male with a history of depression and prior overdose who presents for evaluation after an overdose.  Patient arrives actively vomiting and respiratory distress satting 70% on room air.  Initially tried a nonrebreather with no significant improvement.  Patient was placed on high flow nasal cannula due to concerns for aspiration on BiPAP.  Chest x-ray concerning for  bilateral infiltrates, visualized by me, confirmed by radiology.  Possibly aspiration pneumonia in the setting of drug overdose.  Will cover with Unasyn.  Will swab for Covid.  Patient continued to have moments of decrease mental status and intermittent short-lived episodes of apnea therefore he was started on Narcan drip.  He continues to be on high flow nasal cannula with improvement of his respiratory status.  Patient continues to deny taking more than just 1 Percocet tonight.  Denies any suicidal intent.  Old medical records reviewed including admission in 2017 when patient was in the ICU intubated due to a drug overdose.  Patient will need psych evaluation once medically cleared.       _____________________________________________ Please note:  Patient was evaluated in Emergency Department today for the symptoms described in the history of present illness. Patient was evaluated in the context of the global COVID-19 pandemic, which necessitated consideration that the patient might be at risk for infection with the SARS-CoV-2 virus that causes COVID-19. Institutional protocols and algorithms that pertain to the evaluation of patients at risk for COVID-19 are in a state of rapid change based on information released by regulatory bodies including the CDC and federal and state organizations. These policies and algorithms were followed during the patient's care in the ED.  Some ED evaluations and interventions may be delayed as a result of limited staffing during the pandemic.   [place] Controlled Substance Database was reviewed by me. ____________________________________________   FINAL CLINICAL IMPRESSION(S) / ED DIAGNOSES   Final diagnoses:  Opiate overdose, undetermined intent, initial encounter (HCC)  Acute respiratory failure with hypoxia (HCC)  Aspiration pneumonia, unspecified aspiration pneumonia type, unspecified laterality, unspecified part of lung (HCC)      NEW MEDICATIONS STARTED  DURING THIS VISIT:  ED Discharge Orders    None       Note:  This document was prepared using Dragon voice recognition software and may include unintentional dictation errors.    Don Perking, [place], MD 01/16/21 [phone]

## 2021-01-16 NOTE — ED Notes (Signed)
RN attempted to call lab to verify that blood samples were obtained to have resulted. No answer from lab

## 2021-01-16 NOTE — ED Notes (Addendum)
Pt placed on HFNC and non-rebreather. X-ray at bedside to get chest x-ray

## 2021-01-16 NOTE — ED Notes (Signed)
With permission from pt, father called. RN talked with father and now ED provider is talking with pts father.

## 2021-01-16 NOTE — ED Notes (Signed)
Pt removed his HFNC again. RN replaced. ED provider at bedside.

## 2021-01-16 NOTE — H&P (Signed)
Name: Zachary Soto MRN: [id number] DOB: [birth year]    ADMISSION DATE:  01/16/2021  REFERRING MD : Dr. Don Perking  CHIEF COMPLAINT: Drug Overdose   BRIEF PATIENT DESCRIPTION:  20 yo admitted with drug overdose requiring narcan gtt and subsequently aspirated developing acute hypoxic respiratory failure requiring HHFNC   SIGNIFICANT EVENTS/STUDIES:  02/10: Pt admitted to the stepdown unit per PCCM team following drug overdose   HISTORY OF PRESENT ILLNESS:   This is a 20 yo male with a PMH of Depression and Prior Drug Overdoses.  He presented to [facility] ER on 02/10 following a drug overdose.  Per ER notes EMS reported the pt overdosed earlier this evening requiring narcan.  Following administration of narcan the pt refused transport to the ER for further evaluation.  However, EMS was called again due to pt overdosing a second time.  EMS administered 6 mg of narcan, however he was noted to have agonal respirations and became cyanotic but did not lose his pulses.  Following narcan administration pt vomited.  Upon arrival to the ER pt continued to vomit and developed hypoxia with O2 sats in the 70's on RA requiring HHFNC @100 %.  Per ER notes pt non cooperative, but alert and oriented.  He initially denied drug use, however he later endorsed taking 1 percocet. Narcan gtt initiated.  COVID-19/Influenza PCR negative, however CXR concerning for multifocal pneumonia.  Pt received unasyn for aspiration pneumonia coverage.  PCCM team contacted for hospital admission.  PAST MEDICAL HISTORY :   has a past medical history of Back pain and Scoliosis.  has a past surgical history that includes Cleft palate repair and Tonsillectomy. Prior to Admission medications   Medication Sig Start Date End Date Taking? Authorizing Provider  cyclobenzaprine (FLEXERIL) 10 MG tablet Take 1 tablet (10 mg total) by mouth 3 (three) times daily as needed. 06/17/20   08/18/20, PA-C  HYDROcodone-acetaminophen  (NORCO/VICODIN) 5-325 MG tablet Take 1 tablet by mouth every 6 (six) hours as needed for moderate pain. 11/07/19   Fisher, 14/1/20, PA-C  metoCLOPramide (REGLAN) 10 MG tablet Take 1 tablet (10 mg total) by mouth every 6 (six) hours as needed for nausea (nausea/headache). 02/06/19   04/08/19, MD  naproxen (NAPROSYN) 500 MG tablet Take 1 tablet (500 mg total) by mouth 2 (two) times daily with a meal. 06/17/20   08/18/20, PA-C  predniSONE (DELTASONE) 10 MG tablet Take 3 tablets (30 mg total) by mouth daily with breakfast. 11/07/19   14/1/20 Sherrie Mustache, PA-C   No Known Allergies  FAMILY HISTORY:  family history is not on file. SOCIAL HISTORY:  reports that he has quit smoking. He has never used smokeless tobacco. He reports current alcohol use. He reports current drug use. Drug: Marijuana.  REVIEW OF SYSTEMS: Positives in BOLD  Constitutional: Negative for fever, chills, weight loss, malaise/fatigue and diaphoresis.  HENT: Negative for hearing loss, ear pain, nosebleeds, congestion, sore throat, neck pain, tinnitus and ear discharge.   Eyes: Negative for blurred vision, double vision, photophobia, pain, discharge and redness.  Respiratory: cough, hemoptysis, sputum production, shortness of breath, wheezing and stridor.   Cardiovascular: Negative for chest pain, palpitations, orthopnea, claudication, leg swelling and PND.  Gastrointestinal: heartburn, nausea, vomiting, abdominal pain, diarrhea, constipation, blood in stool and melena.  Genitourinary: Negative for dysuria, urgency, frequency, hematuria and flank pain.  Musculoskeletal: Negative for myalgias, back pain, joint pain and falls.  Skin: Negative for itching and rash.  Neurological: Negative  for dizziness, tingling, tremors, sensory change, speech change, focal weakness, seizures, loss of consciousness, weakness and headaches.  Endo/Heme/Allergies: Negative for environmental allergies and polydipsia. Does not bruise/bleed  easily.  SUBJECTIVE:  Pt weaned to 5L O2 with O2 sats upper 90's without signs of respiratory distress.  Pt denies suicidal or homicidal ideation and states he was not trying to hurt himself.   VITAL SIGNS: Temp:  [97.5 F (36.4 C)] 97.5 F (36.4 C) (02/10 0052) Pulse Rate:  [99-121] 99 (02/10 0229) Resp:  [17-27] 20 (02/10 0229) BP: (113-138)/(78-86) 113/78 (02/10 0225) SpO2:  [84 %-100 %] 100 % (02/10 0229) FiO2 (%):  [98 %] 98 % (02/10 0100) Weight:  [68 kg] 68 kg (02/10 0312)  PHYSICAL EXAMINATION: General: well developed, well nourished male, NAD  Neuro: lethargic, oriented, follows commands, PERRL  HEENT: supple, no JVD  Cardiovascular: nsr, s1s2, no R/G, 2+ radial, 2+ distal pulses, no edema  Lungs: rhonchi throughout, even, non labored  Abdomen: +BS x4, soft, non tender, non distended  Musculoskeletal: normal bulk and tone, moves all extremities  Skin: intact no rashes or lesions present   Recent Labs  Lab 01/16/21 0034 01/16/21 0036  NA 138  --   K 3.5  --   CL 103  --   CO2 19*  --   BUN 13  --   CREATININE 1.07 1.09  GLUCOSE 108*  --    Recent Labs  Lab 01/16/21 0034 01/16/21 0214  HGB 17.9* 16.6  HCT 53.4* 49.0  WBC 17.3* 17.8*  PLT 339 259   DG Chest Portable 1 View  Result Date: 01/16/2021 CLINICAL DATA:  Aspiration EXAM: PORTABLE CHEST 1 VIEW COMPARISON:  None. FINDINGS: The heart size and mediastinal contours are within normal limits. Multifocal ground-glass patchy airspace opacities are seen throughout both lungs probably within the perihilar region and lower lungs. No pleural effusion. IMPRESSION: Multifocal airspace opacities throughout both lungs which could be due to multifocal pneumonia Electronically Signed   By: Jonna Clark M.D.   On: 01/16/2021 00:39    ASSESSMENT / PLAN:  Acute encephalopathy secondary to opiate overdose suspected accidental  Acute hypoxic respiratory failure secondary to aspiration pneumonia Elevated troponin likely  secondary to demand ischemia in the setting of acute respiratory failure   Elevated liver enzymes  Supplemental O2 for dyspnea and/or hypoxia Prn bronchodilator therapy Trend WBC and monitor fever curve  Will check PCT  Continue unasyn Continuous telemetry monitoring  Trend troponin's Continue narcan gtt for now  Prn zofran for nausea/vomiting  Aspiration precautions Polysubstance abuse cessation counseling provided  Urine drug screen pending  Trend hepatic panels  Clear liquid diet  VTE px: SCD's and subq lovenox   Sonda Rumble, AGNP  Pulmonary/Critical Care Pager [phone] (please enter 7 digits) PCCM Consult Pager [phone] (please enter 7 digits)

## 2021-01-16 NOTE — ED Notes (Signed)
Pt taken to ICU with this RN and another Charity fundraiser. Pt taken on stretcher with monitor. Pt taken on non-rebreather at 15lpm and HFNC brought so ICU could use it on unit.

## 2021-01-16 NOTE — ED Notes (Signed)
Pt is requesting water to drink. RN messaged ICU provider. ICU provider stated it was okay to have pt have sips of water.

## 2021-01-16 NOTE — Progress Notes (Addendum)
0700: Shift report received from Ty this morning.Pt is asleep but arousable. He has Narcan drip infusing at 4 mg/hr. He is on 5L nasal cannula satting 98-100%.   Tried patient on room air. He is satting 96-100% if he is awake but he desats to 86-88% when he is asleep. Placed him on 2L nasal cannula and now satiing 96%.   1115: Narcan drip discontinued per Dr. Belia Heman. Pt wanted to go home. MD spoke with him and dad is at bedside. Patient agreed to stay one more day.   1400: Transfer out report given to [employer]. Patient is going to room 225.   1420: Father Roselind Rily was notified of the room transfer. His no. [phone]

## 2021-01-16 NOTE — ED Notes (Signed)
RN attempted to call report 

## 2021-01-16 NOTE — ED Notes (Signed)
CBG for EMS was 226

## 2021-01-17 DIAGNOSIS — T402X4A Poisoning by other opioids, undetermined, initial encounter: Secondary | ICD-10-CM | POA: Diagnosis not present

## 2021-01-17 DIAGNOSIS — T50901A Poisoning by unspecified drugs, medicaments and biological substances, accidental (unintentional), initial encounter: Secondary | ICD-10-CM | POA: Diagnosis not present

## 2021-01-17 LAB — CBC WITH DIFFERENTIAL/PLATELET
Abs Immature Granulocytes: 0.09 10*3/uL — ABNORMAL HIGH (ref 0.00–0.07)
Basophils Absolute: 0 10*3/uL (ref 0.0–0.1)
Basophils Relative: 0 %
Eosinophils Absolute: 0 10*3/uL (ref 0.0–0.5)
Eosinophils Relative: 0 %
HCT: 42 % (ref 39.0–52.0)
Hemoglobin: 14.4 g/dL (ref 13.0–17.0)
Immature Granulocytes: 1 %
Lymphocytes Relative: 7 %
Lymphs Abs: 1.2 10*3/uL (ref 0.7–4.0)
MCH: 30.7 pg (ref 26.0–34.0)
MCHC: 34.3 g/dL (ref 30.0–36.0)
MCV: 89.6 fL (ref 80.0–100.0)
Monocytes Absolute: 0.7 10*3/uL (ref 0.1–1.0)
Monocytes Relative: 4 %
Neutro Abs: 15.2 10*3/uL — ABNORMAL HIGH (ref 1.7–7.7)
Neutrophils Relative %: 88 %
Platelets: 225 10*3/uL (ref 150–400)
RBC: 4.69 MIL/uL (ref 4.22–5.81)
RDW: 12.7 % (ref 11.5–15.5)
WBC: 17.2 10*3/uL — ABNORMAL HIGH (ref 4.0–10.5)
nRBC: 0 % (ref 0.0–0.2)

## 2021-01-17 LAB — COMPREHENSIVE METABOLIC PANEL
ALT: 35 U/L (ref 0–44)
AST: 27 U/L (ref 15–41)
Albumin: 4.3 g/dL (ref 3.5–5.0)
Alkaline Phosphatase: 56 U/L (ref 38–126)
Anion gap: 9 (ref 5–15)
BUN: 10 mg/dL (ref 6–20)
CO2: 26 mmol/L (ref 22–32)
Calcium: 9.1 mg/dL (ref 8.9–10.3)
Chloride: 103 mmol/L (ref 98–111)
Creatinine, Ser: 0.98 mg/dL (ref 0.61–1.24)
GFR, Estimated: >60 mL/min (ref >60)
Glucose, Bld: 103 mg/dL — ABNORMAL HIGH (ref 70–99)
Potassium: 3.5 mmol/L (ref 3.5–5.1)
Sodium: 138 mmol/L (ref 135–145)
Total Bilirubin: 1.2 mg/dL (ref 0.3–1.2)
Total Protein: 7.2 g/dL (ref 6.5–8.1)

## 2021-01-17 MED ORDER — AMOXICILLIN-POT CLAVULANATE 875-125 MG PO TABS: 1.0000 | ORAL_TABLET | Freq: Two times a day (BID) | ORAL | 0 refills | Status: AC

## 2021-01-17 MED ORDER — KETOROLAC TROMETHAMINE 15 MG/ML IJ SOLN
15.0000 mg | Freq: Four times a day (QID) | INTRAMUSCULAR | Status: DC | PRN
  Administered 2021-01-17: 15 mg via INTRAVENOUS
  Filled 2021-01-17: qty 1

## 2021-01-17 MED ORDER — AMOXICILLIN-POT CLAVULANATE 875-125 MG PO TABS: 1.0000 | ORAL_TABLET | Freq: Two times a day (BID) | ORAL | Status: DC

## 2021-01-17 NOTE — Clinical Social Work Note (Signed)
Gave SA resource list. CSW signing off.  Charlynn Court, CSW [phone]

## 2021-01-17 NOTE — Plan of Care (Signed)
  Problem: Education: Goal: Knowledge of General Education information will improve Description: Including pain rating scale, medication(s)/side effects and non-pharmacologic comfort measures 01/17/2021 1313 by Ansel Bong, RN Outcome: Adequate for Discharge 01/17/2021 1313 by Ansel Bong, RN Outcome: Adequate for Discharge   Problem: Health Behavior/Discharge Planning: Goal: Ability to manage health-related needs will improve 01/17/2021 1313 by Ansel Bong, RN Outcome: Adequate for Discharge 01/17/2021 1313 by Ansel Bong, RN Outcome: Adequate for Discharge   Problem: Clinical Measurements: Goal: Ability to maintain clinical measurements within normal limits will improve 01/17/2021 1313 by Ansel Bong, RN Outcome: Adequate for Discharge 01/17/2021 1313 by Ansel Bong, RN Outcome: Adequate for Discharge Goal: Will remain free from infection 01/17/2021 1313 by Ansel Bong, RN Outcome: Adequate for Discharge 01/17/2021 1313 by Ansel Bong, RN Outcome: Adequate for Discharge Goal: Diagnostic test results will improve 01/17/2021 1313 by Ansel Bong, RN Outcome: Adequate for Discharge 01/17/2021 1313 by Ansel Bong, RN Outcome: Adequate for Discharge Goal: Respiratory complications will improve 01/17/2021 1313 by Ansel Bong, RN Outcome: Adequate for Discharge 01/17/2021 1313 by Ansel Bong, RN Outcome: Adequate for Discharge Goal: Cardiovascular complication will be avoided 01/17/2021 1313 by Ansel Bong, RN Outcome: Adequate for Discharge 01/17/2021 1313 by Ansel Bong, RN Outcome: Adequate for Discharge   Problem: Activity: Goal: Risk for activity intolerance will decrease 01/17/2021 1313 by Ansel Bong, RN Outcome: Adequate for Discharge 01/17/2021 1313 by Ansel Bong, RN Outcome: Adequate for Discharge   Problem: Nutrition: Goal: Adequate nutrition will be maintained 01/17/2021 1313 by Ansel Bong, RN Outcome: Adequate for  Discharge 01/17/2021 1313 by Ansel Bong, RN Outcome: Adequate for Discharge   Problem: Coping: Goal: Level of anxiety will decrease 01/17/2021 1313 by Ansel Bong, RN Outcome: Adequate for Discharge 01/17/2021 1313 by Ansel Bong, RN Outcome: Adequate for Discharge   Problem: Elimination: Goal: Will not experience complications related to bowel motility 01/17/2021 1313 by Ansel Bong, RN Outcome: Adequate for Discharge 01/17/2021 1313 by Ansel Bong, RN Outcome: Adequate for Discharge Goal: Will not experience complications related to urinary retention 01/17/2021 1313 by Ansel Bong, RN Outcome: Adequate for Discharge 01/17/2021 1313 by Ansel Bong, RN Outcome: Adequate for Discharge   Problem: Pain Managment: Goal: General experience of comfort will improve 01/17/2021 1313 by Ansel Bong, RN Outcome: Adequate for Discharge 01/17/2021 1313 by Ansel Bong, RN Outcome: Adequate for Discharge   Problem: Safety: Goal: Ability to remain free from injury will improve 01/17/2021 1313 by Ansel Bong, RN Outcome: Adequate for Discharge 01/17/2021 1313 by Ansel Bong, RN Outcome: Adequate for Discharge   Problem: Skin Integrity: Goal: Risk for impaired skin integrity will decrease 01/17/2021 1313 by Ansel Bong, RN Outcome: Adequate for Discharge 01/17/2021 1313 by Ansel Bong, RN Outcome: Adequate for Discharge

## 2021-01-17 NOTE — Care Management Obs Status (Signed)
MEDICARE OBSERVATION STATUS NOTIFICATION   Patient Details  Name: Zachary Soto MRN: [id number] Date of Birth: 2001-03-17   Medicare Observation Status Notification Given:  Yes    Margarito Liner, LCSW 01/17/2021, 2:40 PM

## 2021-01-17 NOTE — Care Management CC44 (Signed)
Condition Code 44 Documentation Completed  Patient Details  Name: Zachary Soto MRN: [id number] Date of Birth: Dec 21, 2000   Condition Code 44 given:  Yes Patient signature on Condition Code 44 notice:  Yes Documentation of 2 MD's agreement:  Yes Code 44 added to claim:  Yes    Margarito Liner, LCSW 01/17/2021, 2:40 PM

## 2021-01-17 NOTE — Discharge Summary (Signed)
Physician Discharge Summary  Zachary Soto WIO:[id number] DOB: May 18, 2001 DOA: 01/16/2021  PCP: Patient, No Pcp Per  Admit date: 01/16/2021 Discharge date: 01/17/2021  Admitted From: Home Disposition:  Home  Recommendations for Outpatient Follow-up:  1. Follow up with PCP in 1-2 weeks 2.   Home Health:No Equipment/Devices:None Discharge Condition:Stable CODE STATUS:FULL Diet recommendation: Regular   Brief/Interim Summary: 20 yo male with a PMH of Depression and Prior Drug Overdoses.  He presented to [facility] ER on 02/10 following a drug overdose.  Per ER notes EMS reported the pt overdosed earlier this evening requiring narcan.  Following administration of narcan the pt refused transport to the ER for further evaluation.  However, EMS was called again due to pt overdosing a second time.  EMS administered 6 mg of narcan, however he was noted to have agonal respirations and became cyanotic but did not lose his pulses.  Following narcan administration pt vomited.  Upon arrival to the ER pt continued to vomit and developed hypoxia with O2 sats in the 70's on RA requiring HHFNC @100 %.  Per ER notes pt non cooperative, but alert and oriented.  He initially denied drug use, however he later endorsed taking 1 percocet. Narcan gtt initiated.  COVID-19/Influenza PCR negative, however CXR concerning for multifocal pneumonia.  Pt received unasyn for aspiration pneumonia coverage.  PCCM team contacted for hospital admission.  Patient never required intubation however did require heated high flow nasal cannula for short period of time.  He was weaned to 5 L then to room air.  Transferred to [facility] service after discontinuation of heated high flow nasal cannula.  Remained hemodynamically stable.  Oxygen weaned off at time of discharge.  Patient mentating clearly and is no longer lethargic.  We had a lengthy conversation regarding the overdose and the dangers of narcotic medications.  I also contacted  TOC to provide substance abuse cessation resources.  I discussed discharge plan with patient's father over the phone.  All questions were answered.  Patient discharged home in stable condition.  Will prescribe Augmentin to complete empiric course for suspected aspiration pneumonia/pneumonitis.   Discharge Diagnoses:  Active Problems:   Drug overdose  Acute hypoxic respiratory failure Opioid overdose Ischemic hepatopathy Elevated troponin, demand ischemia Patient admitted after opioid overdose.  Elevated troponin and liver enzymes likely secondary to transient ischemia.  Encephalopathy and hypoxia did resolve patient did require Narcan GTT for short period of time.  At time of discharge substance abuse cessation counseling provided.  Patient tolerating diet without issue.  Will transition to Augmentin to complete empiric course for aspiration pneumonia.  Patient discharged in stable condition.  Discharge Instructions  Discharge Instructions    Diet - low sodium heart healthy   Complete by: As directed    Diet - low sodium heart healthy   Complete by: As directed    Increase activity slowly   Complete by: As directed    Increase activity slowly   Complete by: As directed      Allergies as of 01/17/2021   No Known Allergies     Medication List    STOP taking these medications   HYDROcodone-acetaminophen 5-325 MG tablet Commonly known as: NORCO/VICODIN   predniSONE 10 MG tablet Commonly known as: DELTASONE     TAKE these medications   amoxicillin-clavulanate 875-125 MG tablet Commonly known as: AUGMENTIN Take 1 tablet by mouth every 12 (twelve) hours for 6 days.   cyclobenzaprine 10 MG tablet Commonly known as: FLEXERIL Take 1 tablet (  10 mg total) by mouth 3 (three) times daily as needed.   metoCLOPramide 10 MG tablet Commonly known as: Reglan Take 1 tablet (10 mg total) by mouth every 6 (six) hours as needed for nausea (nausea/headache).   naproxen 500 MG  tablet Commonly known as: Naprosyn Take 1 tablet (500 mg total) by mouth 2 (two) times daily with a meal.       No Known Allergies  Consultations:  PCCM   Procedures/Studies: DG Chest Portable 1 View  Result Date: 01/16/2021 CLINICAL DATA:  Aspiration EXAM: PORTABLE CHEST 1 VIEW COMPARISON:  None. FINDINGS: The heart size and mediastinal contours are within normal limits. Multifocal ground-glass patchy airspace opacities are seen throughout both lungs probably within the perihilar region and lower lungs. No pleural effusion. IMPRESSION: Multifocal airspace opacities throughout both lungs which could be due to multifocal pneumonia Electronically Signed   By: Jonna Clark M.D.   On: 01/16/2021 00:39    (Echo, Carotid, EGD, Colonoscopy, ERCP)    Subjective: Patient seen and examined on the day of discharge.  Stable, no distress.  Stable for discharge home.  Discharge plan discussed with patient and patient's father via phone.  Discharge Exam: Vitals:   01/17/21 0100 01/17/21 0445  BP: 134/83 (!) 112/59  Pulse: 92 74  Resp:  18  Temp: 97.9 F (36.6 C) 98.5 F (36.9 C)  SpO2: 94% 98%   Vitals:   01/16/21 2005 01/17/21 0100 01/17/21 0445 01/17/21 0448  BP:  134/83 (!) 112/59   Pulse:  92 74   Resp:   18   Temp:  97.9 F (36.6 C) 98.5 F (36.9 C)   TempSrc:  Oral Oral   SpO2: 96% 94% 98%   Weight:    73.8 kg  Height:        General: Pt is alert, awake, not in acute distress Cardiovascular: RRR, S1/S2 +, no rubs, no gallops Respiratory: CTA bilaterally, no wheezing, no rhonchi Abdominal: Soft, NT, ND, bowel sounds + Extremities: no edema, no cyanosis    The results of significant diagnostics from this hospitalization (including imaging, microbiology, ancillary and laboratory) are listed below for reference.     Microbiology: Recent Results (from the past 240 hour(s))  Resp Panel by RT-PCR (Flu A&B, Covid) Nasopharyngeal Swab     Status: None   Collection Time:  01/16/21 12:36 AM   Specimen: Nasopharyngeal Swab; Nasopharyngeal(NP) swabs in vial transport medium  Result Value Ref Range Status   SARS Coronavirus 2 by RT PCR NEGATIVE NEGATIVE Final    Comment: (NOTE) SARS-CoV-2 target nucleic acids are NOT DETECTED.  The SARS-CoV-2 RNA is generally detectable in upper respiratory specimens during the acute phase of infection. The lowest concentration of SARS-CoV-2 viral copies this assay can detect is 138 copies/mL. A negative result does not preclude SARS-Cov-2 infection and should not be used as the sole basis for treatment or other patient management decisions. A negative result may occur with  improper specimen collection/handling, submission of specimen other than nasopharyngeal swab, presence of viral mutation(s) within the areas targeted by this assay, and inadequate number of viral copies(<138 copies/mL). A negative result must be combined with clinical observations, patient history, and epidemiological information. The expected result is Negative.  Fact Sheet for Patients:  BloggerCourse.com  Fact Sheet for Healthcare Providers:  SeriousBroker.it  This test is no t yet approved or cleared by the [place] FDA and  has been authorized for detection and/or diagnosis of SARS-CoV-2 by FDA under an Emergency  Use Authorization (EUA). This EUA will remain  in effect (meaning this test can be used) for the duration of the COVID-19 declaration under Section 564(b)(1) of the Act, 21 U.S.C.section 360bbb-3(b)(1), unless the authorization is terminated  or revoked sooner.       Influenza A by PCR NEGATIVE NEGATIVE Final   Influenza B by PCR NEGATIVE NEGATIVE Final    Comment: (NOTE) The Xpert Xpress SARS-CoV-2/FLU/RSV plus assay is intended as an aid in the diagnosis of influenza from Nasopharyngeal swab specimens and should not be used as a sole basis for treatment. Nasal washings  and aspirates are unacceptable for Xpert Xpress SARS-CoV-2/FLU/RSV testing.  Fact Sheet for Patients: BloggerCourse.com  Fact Sheet for Healthcare Providers: SeriousBroker.it  This test is not yet approved or cleared by the [place] FDA and has been authorized for detection and/or diagnosis of SARS-CoV-2 by FDA under an Emergency Use Authorization (EUA). This EUA will remain in effect (meaning this test can be used) for the duration of the COVID-19 declaration under Section 564(b)(1) of the Act, 21 U.S.C. section 360bbb-3(b)(1), unless the authorization is terminated or revoked.  Performed at [facility], [address] [place], [place] [address]      Labs: BNP (last 3 results) No results for input(s): BNP in the last 8760 hours. Basic Metabolic Panel: Recent Labs  Lab 01/16/21 0034 01/16/21 0036  NA 138  --   K 3.5  --   CL 103  --   CO2 19*  --   GLUCOSE 108*  --   BUN 13  --   CREATININE 1.07 1.09  CALCIUM 8.9  --   MG  --  2.3  PHOS  --  5.1*   Liver Function Tests: Recent Labs  Lab 01/16/21 0034  AST 57*  ALT 57*  ALKPHOS 98  BILITOT 0.6  PROT 7.7  ALBUMIN 4.7   No results for input(s): LIPASE, AMYLASE in the last 168 hours. No results for input(s): AMMONIA in the last 168 hours. CBC: Recent Labs  Lab 01/16/21 0034 01/16/21 0214  WBC 17.3* 17.8*  NEUTROABS 14.9*  --   HGB 17.9* 16.6  HCT 53.4* 49.0  MCV 90.2 90.1  PLT 339 259   Cardiac Enzymes: No results for input(s): CKTOTAL, CKMB, CKMBINDEX, TROPONINI in the last 168 hours. BNP: Invalid input(s): POCBNP CBG: Recent Labs  Lab 01/16/21 0257  GLUCAP 118*   D-Dimer No results for input(s): DDIMER in the last 72 hours. Hgb A1c No results for input(s): HGBA1C in the last 72 hours. Lipid Profile No results for input(s): CHOL, HDL, LDLCALC, TRIG, CHOLHDL, LDLDIRECT in the last 72 hours. Thyroid function studies No  results for input(s): TSH, T4TOTAL, T3FREE, THYROIDAB in the last 72 hours.  Invalid input(s): FREET3 Anemia work up No results for input(s): VITAMINB12, FOLATE, FERRITIN, TIBC, IRON, RETICCTPCT in the last 72 hours. Urinalysis    Component Value Date/Time   COLORURINE AMBER (A) 07/03/2016 0400   APPEARANCEUR CLEAR 07/03/2016 0400   LABSPEC 1.020 07/03/2016 0400   PHURINE 6.0 07/03/2016 0400   GLUCOSEU NEGATIVE 07/03/2016 0400   HGBUR NEGATIVE 07/03/2016 0400   BILIRUBINUR NEGATIVE 07/03/2016 0400   KETONESUR NEGATIVE 07/03/2016 0400   PROTEINUR NEGATIVE 07/03/2016 0400   NITRITE NEGATIVE 07/03/2016 0400   LEUKOCYTESUR NEGATIVE 07/03/2016 0400   Sepsis Labs Invalid input(s): PROCALCITONIN,  WBC,  LACTICIDVEN Microbiology Recent Results (from the past 240 hour(s))  Resp Panel by RT-PCR (Flu A&B, Covid) Nasopharyngeal Swab  Status: None   Collection Time: 01/16/21 12:36 AM   Specimen: Nasopharyngeal Swab; Nasopharyngeal(NP) swabs in vial transport medium  Result Value Ref Range Status   SARS Coronavirus 2 by RT PCR NEGATIVE NEGATIVE Final    Comment: (NOTE) SARS-CoV-2 target nucleic acids are NOT DETECTED.  The SARS-CoV-2 RNA is generally detectable in upper respiratory specimens during the acute phase of infection. The lowest concentration of SARS-CoV-2 viral copies this assay can detect is 138 copies/mL. A negative result does not preclude SARS-Cov-2 infection and should not be used as the sole basis for treatment or other patient management decisions. A negative result may occur with  improper specimen collection/handling, submission of specimen other than nasopharyngeal swab, presence of viral mutation(s) within the areas targeted by this assay, and inadequate number of viral copies(<138 copies/mL). A negative result must be combined with clinical observations, patient history, and epidemiological information. The expected result is Negative.  Fact Sheet for  Patients:  BloggerCourse.com  Fact Sheet for Healthcare Providers:  SeriousBroker.it  This test is no t yet approved or cleared by the [place] FDA and  has been authorized for detection and/or diagnosis of SARS-CoV-2 by FDA under an Emergency Use Authorization (EUA). This EUA will remain  in effect (meaning this test can be used) for the duration of the COVID-19 declaration under Section 564(b)(1) of the Act, 21 U.S.C.section 360bbb-3(b)(1), unless the authorization is terminated  or revoked sooner.       Influenza A by PCR NEGATIVE NEGATIVE Final   Influenza B by PCR NEGATIVE NEGATIVE Final    Comment: (NOTE) The Xpert Xpress SARS-CoV-2/FLU/RSV plus assay is intended as an aid in the diagnosis of influenza from Nasopharyngeal swab specimens and should not be used as a sole basis for treatment. Nasal washings and aspirates are unacceptable for Xpert Xpress SARS-CoV-2/FLU/RSV testing.  Fact Sheet for Patients: BloggerCourse.com  Fact Sheet for Healthcare Providers: SeriousBroker.it  This test is not yet approved or cleared by the [place] FDA and has been authorized for detection and/or diagnosis of SARS-CoV-2 by FDA under an Emergency Use Authorization (EUA). This EUA will remain in effect (meaning this test can be used) for the duration of the COVID-19 declaration under Section 564(b)(1) of the Act, 21 U.S.C. section 360bbb-3(b)(1), unless the authorization is terminated or revoked.  Performed at [facility], [address] [place], [place] [address]      Time coordinating discharge: Over 30 minutes  SIGNED:   Tresa Moore, MD  [facility] 01/17/2021, 2:04 PM Pager   If 7PM-7AM, please contact night-coverage

## 2021-01-17 NOTE — Progress Notes (Signed)
This RN delivered discharge instructions and teaching to the patient. The patient verbalized and demonstrated understanding of the provided instructions. All outstanding questions resolved. Both L and R arm PIVs removed. Pt tolerated well. Both cannulas intact. All belongings packed and in tow, waiting for father to come and pick up for discharge.

## 2021-03-13 ENCOUNTER — Encounter: Payer: Self-pay | Admitting: Internal Medicine

## 2021-03-13 ENCOUNTER — Observation Stay
Admission: EM | Admit: 2021-03-13 | Discharge: 2021-03-14 | Disposition: A | Discharge status: Home / Self Care | Payer: Medicare Other | Attending: Internal Medicine | Admitting: Internal Medicine

## 2021-03-13 ENCOUNTER — Emergency Department: Payer: Medicare Other

## 2021-03-13 DIAGNOSIS — F1910 Other psychoactive substance abuse, uncomplicated: Secondary | ICD-10-CM

## 2021-03-13 DIAGNOSIS — N179 Acute kidney failure, unspecified: Secondary | ICD-10-CM | POA: Diagnosis present

## 2021-03-13 DIAGNOSIS — M6282 Rhabdomyolysis: Secondary | ICD-10-CM | POA: Diagnosis not present

## 2021-03-13 DIAGNOSIS — Z20822 Contact with and (suspected) exposure to covid-19: Secondary | ICD-10-CM | POA: Insufficient documentation

## 2021-03-13 DIAGNOSIS — F1999 Other psychoactive substance use, unspecified with unspecified psychoactive substance-induced disorder: Secondary | ICD-10-CM | POA: Insufficient documentation

## 2021-03-13 DIAGNOSIS — D72829 Elevated white blood cell count, unspecified: Secondary | ICD-10-CM | POA: Diagnosis not present

## 2021-03-13 DIAGNOSIS — T50901A Poisoning by unspecified drugs, medicaments and biological substances, accidental (unintentional), initial encounter: Principal | ICD-10-CM

## 2021-03-13 DIAGNOSIS — R4182 Altered mental status, unspecified: Secondary | ICD-10-CM | POA: Diagnosis present

## 2021-03-13 LAB — URINE DRUG SCREEN, QUALITATIVE (ARMC ONLY)
Amphetamines, Ur Screen: NOT DETECTED
Barbiturates, Ur Screen: NOT DETECTED
Benzodiazepine, Ur Scrn: NOT DETECTED
Cannabinoid 50 Ng, Ur ~~LOC~~: POSITIVE — AB
Cocaine Metabolite,Ur ~~LOC~~: NOT DETECTED
MDMA (Ecstasy)Ur Screen: NOT DETECTED
Methadone Scn, Ur: NOT DETECTED
Opiate, Ur Screen: NOT DETECTED
Phencyclidine (PCP) Ur S: NOT DETECTED
Tricyclic, Ur Screen: NOT DETECTED

## 2021-03-13 LAB — CK: Total CK: 2604 U/L — ABNORMAL HIGH (ref 49–397)

## 2021-03-13 LAB — CBC WITH DIFFERENTIAL/PLATELET
Abs Immature Granulocytes: 0.08 10*3/uL — ABNORMAL HIGH (ref 0.00–0.07)
Basophils Absolute: 0.1 10*3/uL (ref 0.0–0.1)
Basophils Relative: 0 %
Eosinophils Absolute: 0.1 10*3/uL (ref 0.0–0.5)
Eosinophils Relative: 0 %
HCT: 41 % (ref 39.0–52.0)
Hemoglobin: 13.9 g/dL (ref 13.0–17.0)
Immature Granulocytes: 0 %
Lymphocytes Relative: 14 %
Lymphs Abs: 2.7 10*3/uL (ref 0.7–4.0)
MCH: 30.8 pg (ref 26.0–34.0)
MCHC: 33.9 g/dL (ref 30.0–36.0)
MCV: 90.9 fL (ref 80.0–100.0)
Monocytes Absolute: 1.2 10*3/uL — ABNORMAL HIGH (ref 0.1–1.0)
Monocytes Relative: 6 %
Neutro Abs: 14.4 10*3/uL — ABNORMAL HIGH (ref 1.7–7.7)
Neutrophils Relative %: 80 %
Platelets: 414 10*3/uL — ABNORMAL HIGH (ref 150–400)
RBC: 4.51 MIL/uL (ref 4.22–5.81)
RDW: 12.5 % (ref 11.5–15.5)
WBC: 18.4 10*3/uL — ABNORMAL HIGH (ref 4.0–10.5)
nRBC: 0 % (ref 0.0–0.2)

## 2021-03-13 LAB — URINALYSIS, COMPLETE (UACMP) WITH MICROSCOPIC
Bacteria, UA: NONE SEEN
Bilirubin Urine: NEGATIVE
Glucose, UA: NEGATIVE mg/dL
Hgb urine dipstick: NEGATIVE
Ketones, ur: NEGATIVE mg/dL
Leukocytes,Ua: NEGATIVE
Nitrite: NEGATIVE
Protein, ur: NEGATIVE mg/dL
Specific Gravity, Urine: 1.019 (ref 1.005–1.030)
Squamous Epithelial / HPF: NONE SEEN (ref 0–5)
WBC, UA: NONE SEEN WBC/hpf (ref 0–5)
pH: 6 (ref 5.0–8.0)

## 2021-03-13 LAB — COMPREHENSIVE METABOLIC PANEL
ALT: 37 U/L (ref 0–44)
AST: 66 U/L — ABNORMAL HIGH (ref 15–41)
Albumin: 4.8 g/dL (ref 3.5–5.0)
Alkaline Phosphatase: 104 U/L (ref 38–126)
Anion gap: 11 (ref 5–15)
BUN: 14 mg/dL (ref 6–20)
CO2: 27 mmol/L (ref 22–32)
Calcium: 9 mg/dL (ref 8.9–10.3)
Chloride: 101 mmol/L (ref 98–111)
Creatinine, Ser: 1.28 mg/dL — ABNORMAL HIGH (ref 0.61–1.24)
GFR, Estimated: >60 mL/min (ref >60)
Glucose, Bld: 119 mg/dL — ABNORMAL HIGH (ref 70–99)
Potassium: 4 mmol/L (ref 3.5–5.1)
Sodium: 139 mmol/L (ref 135–145)
Total Bilirubin: 0.6 mg/dL (ref 0.3–1.2)
Total Protein: 7.4 g/dL (ref 6.5–8.1)

## 2021-03-13 LAB — ETHANOL: Alcohol, Ethyl (B): <10 mg/dL (ref <10)

## 2021-03-13 LAB — TROPONIN I (HIGH SENSITIVITY): Troponin I (High Sensitivity): 15 ng/L (ref <18)

## 2021-03-13 LAB — ACETAMINOPHEN LEVEL: Acetaminophen (Tylenol), Serum: <10 ug/mL — ABNORMAL LOW (ref 10–30)

## 2021-03-13 LAB — SALICYLATE LEVEL: Salicylate Lvl: <7 mg/dL — ABNORMAL LOW (ref 7.0–30.0)

## 2021-03-13 MED ORDER — ACETAMINOPHEN 650 MG RE SUPP: 650.0000 mg | Freq: Four times a day (QID) | RECTAL | Status: DC | PRN

## 2021-03-13 MED ORDER — LACTATED RINGERS IV BOLUS
1000.0000 mL | Freq: Once | INTRAVENOUS | Status: AC
  Administered 2021-03-14: 1000 mL via INTRAVENOUS

## 2021-03-13 MED ORDER — SODIUM CHLORIDE 0.9 % IV SOLN: INTRAVENOUS | Status: DC

## 2021-03-13 MED ORDER — LACTATED RINGERS IV BOLUS
1000.0000 mL | Freq: Once | INTRAVENOUS | Status: AC
  Administered 2021-03-13: 1000 mL via INTRAVENOUS

## 2021-03-13 MED ORDER — SODIUM CHLORIDE 0.9% FLUSH
3.0000 mL | Freq: Two times a day (BID) | INTRAVENOUS | Status: DC
  Administered 2021-03-14: 3 mL via INTRAVENOUS

## 2021-03-13 MED ORDER — LORAZEPAM 2 MG/ML IJ SOLN
1.0000 mg | Freq: Once | INTRAMUSCULAR | Status: AC
  Administered 2021-03-13: 1 mg via INTRAVENOUS
  Filled 2021-03-13: qty 1

## 2021-03-13 MED ORDER — ENOXAPARIN SODIUM 40 MG/0.4ML ~~LOC~~ SOLN
40.0000 mg | Freq: Every day | SUBCUTANEOUS | Status: DC
  Filled 2021-03-13: qty 0.4

## 2021-03-13 MED ORDER — ACETAMINOPHEN 325 MG PO TABS: 650.0000 mg | ORAL_TABLET | Freq: Four times a day (QID) | ORAL | Status: DC | PRN

## 2021-03-13 NOTE — ED Provider Notes (Signed)
[facility] Emergency Department Provider Note   ____________________________________________   Event Date/Time   First MD Initiated Contact with Patient 03/13/21 2135     (approximate)  I have reviewed the triage vital signs and the nursing notes.   HISTORY  Chief Complaint Drug Overdose    HPI Zachary Soto is a 20 y.o. male with past medical history of polysubstance abuse who presents to the ED for altered mental status.  History is limited due to patient's confusion.  He was initially brought to the ED by his father for "not acting right."  On arrival, he is diaphoretic and alternates between being somnolent and hyperactive with jerking movements.  He repeatedly states " I am okay, I am okay."  He admits to taking 1 Percocet tonight but denies other drug use or alcohol consumption.  His father reported concern that he had overdosed on something and patient does have a history of opiate overdose requiring admission.  Patient denies any intent to harm himself.        Past Medical History:  Diagnosis Date  . Back pain   . Scoliosis     Patient Active Problem List   Diagnosis Date Noted  . Drug overdose 01/16/2021  . Depression 07/06/2016  . 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 CLEFT PALATE REPAIR    . TONSILLECTOMY      Prior to Admission medications   Not on File    Allergies Patient has no known allergies.  No family history on file.  Social History Social History   Tobacco Use  . Smoking status: Former Games developer  . Smokeless tobacco: Never Used  Vaping Use  . Vaping Use: Never used  Substance Use Topics  . Alcohol use: Yes    Comment: "blunts this morning"  . Drug use: Yes    Types: Marijuana    Review of Systems Unable to obtain secondary to altered mental status  ____________________________________________   PHYSICAL EXAM:  VITAL SIGNS: ED Triage Vitals  Enc  Vitals Group     BP      Pulse      Resp      Temp      Temp src      SpO2      Weight      Height      Head Circumference      Peak Flow      Pain Score      Pain Loc      Pain Edu?      Excl. in GC?     Constitutional: Intermittently somnolent and hyperactive, extremely diaphoretic. Eyes: Conjunctivae are normal.  Pupils pinpoint bilaterally. Head: Atraumatic. Nose: No congestion/rhinnorhea. Mouth/Throat: Mucous membranes are moist. Neck: Normal ROM Cardiovascular: Tachycardic, regular rhythm. Grossly normal heart sounds. Respiratory: Normal respiratory effort.  No retractions. Lungs CTAB. Gastrointestinal: Soft and nontender. No distention. Genitourinary: deferred Musculoskeletal: No lower extremity tenderness nor edema. Neurologic:  Normal speech and language. No gross focal neurologic deficits are appreciated. Skin:  Skin is warm, dry and intact. No rash noted. Psychiatric: Mood and affect are normal. Speech and behavior are normal.  ____________________________________________   LABS (all labs ordered are listed, but only abnormal results are displayed)  Labs Reviewed  CBC WITH DIFFERENTIAL/PLATELET - Abnormal; Notable for the following components:      Result Value   WBC 18.4 (*)    Platelets 414 (*)  Neutro Abs 14.4 (*)    Monocytes Absolute 1.2 (*)    Abs Immature Granulocytes 0.08 (*)    All other components within normal limits  COMPREHENSIVE METABOLIC PANEL - Abnormal; Notable for the following components:   Glucose, Bld 119 (*)    Creatinine, Ser 1.28 (*)    AST 66 (*)    All other components within normal limits  ACETAMINOPHEN LEVEL - Abnormal; Notable for the following components:   Acetaminophen (Tylenol), Serum <10 (*)    All other components within normal limits  SALICYLATE LEVEL - Abnormal; Notable for the following components:   Salicylate Lvl <7.0 (*)    All other components within normal limits  CK - Abnormal; Notable for the following  components:   Total CK 2,604 (*)    All other components within normal limits  SARS CORONAVIRUS 2 (TAT 6-24 HRS)  ETHANOL  URINALYSIS, COMPLETE (UACMP) WITH MICROSCOPIC  URINE DRUG SCREEN, QUALITATIVE ([facility] ONLY)  TROPONIN I (HIGH SENSITIVITY)   ____________________________________________  EKG  ED ECG REPORT I, Chesley Noon, the attending physician, personally viewed and interpreted this ECG.   Date: 03/13/2021  EKG Time: 21:44  Rate: 128  Rhythm: sinus tachycardia  Axis: Normal  Intervals:none  ST&T Change: None   PROCEDURES  Procedure(s) performed (including Critical Care):  .Critical Care Performed by: Chesley Noon, MD Authorized by: Chesley Noon, MD   Critical care provider statement:    Critical care time (minutes):  45   Critical care time was exclusive of:  Separately billable procedures and treating other patients and teaching time   Critical care was necessary to treat or prevent imminent or life-threatening deterioration of the following conditions:  Metabolic crisis   Critical care was time spent personally by me on the following activities:  Discussions with consultants, evaluation of patient's response to treatment, examination of patient, ordering and performing treatments and interventions, ordering and review of laboratory studies, ordering and review of radiographic studies, pulse oximetry, re-evaluation of patient's condition, obtaining history from patient or surrogate and review of old charts   I assumed direction of critical care for this patient from another provider in my specialty: no     Care discussed with: admitting provider       ____________________________________________   INITIAL IMPRESSION / ASSESSMENT AND PLAN / ED COURSE       20 year old male with past medical history of polysubstance abuse who presents to the ED for altered mental status after being found by his father.  Patient admits to taking "1 Percocet" but  otherwise denies illicit drug use.  He is tachycardic, intermittently somnolent and then agitated with pinpoint pupils.  He likely has opiate medications on board but I am also concerned for methamphetamine or cocaine abuse.  He was hydrated with IV fluids and given a dose of 1 mg of Ativan, heart rate now gradually improving and patient less diaphoretic.  EKG shows sinus tachycardia without ischemic changes, electrolytes within normal limits, but CK elevated to greater than 2000 concerning for rhabdomyolysis.  We will continue IV fluid hydration with second liter of LR.  Patient with leukocytosis but no evidence of infectious process at this time.  Chest x-ray reviewed by me with no infiltrate, edema, or effusion.  Plan to discuss with hospitalist for admission.      ____________________________________________   FINAL CLINICAL IMPRESSION(S) / ED DIAGNOSES  Final diagnoses:  Non-traumatic rhabdomyolysis  Polysubstance abuse [facility])     ED Discharge Orders    None  Note:  This document was prepared using Dragon voice recognition software and may include unintentional dictation errors.   Chesley Noon, MD 03/13/21 [phone]

## 2021-03-13 NOTE — H&P (Signed)
History and Physical   Zachary Soto MWN:[id number] DOB: [birth year] DOA: 03/13/2021  PCP: Patient, No Pcp Per (Inactive)   Patient coming from: Home  Chief Complaint: Altered mental status  HPI: Zachary Soto is a 20 y.o. male with medical history significant of polysubstance use, overdose unintentional, depression who presents with altered mental status.  Some history obtained with chart review.  He was brought to the ED by his father as he was "not acting right."  While in the ED he was alternating between being hyperactive (with associated jerking movements) and somnolent.  He repeatedly told the staff in the ED that "I am okay ".  He eventually endorsed taking "1 Percocet ".  He tells me that he took 1 may be 1-1/2 Percocets.  He was more alert and answering questions appropriately when I spoke with him. Denies any intention to harm himself.  Denies any other illicit substance use.  Reports he does use marijuana at times but denies any cocaine or methamphetamine use.  He denies fevers, chills, chest pain, short of breath, abdominal pain, constipation, diarrhea, nausea, vomiting.  Of note, patient was previously admitted for overdose and had a more significant overdose in February requiring ICU admission on Narcan drip.  At that time he also reported taking "1 Percocet ".  ED Course: Vital signs in ED significant for initial tachycardia to the 130s improved to the 100s.  Initial blood pressure in the 170 systolic improved to the 140s.  Vital signs otherwise stable.  Lab work-up showed CMP with creatinine of 1.28 up from baseline around 0.9.  Glucose 119.  AST 66.  CBC showed leukocytosis to 18, platelets 414.  CK elevated to 2600.  Troponin normal with repeat pending.  Respiratory panel for flu and COVID pending.  Ethanol level, Tylenol level, aspirin level normal.  Urinalysis normal.  UDS pending.  Chest x-ray without acute disease.  Patient received 2 L of IV fluids  and dose of Ativan in the ED.  Review of Systems: As per HPI otherwise all other systems reviewed and are negative.  Past Medical History:  Diagnosis Date  . Back pain   . Scoliosis     Past Surgical History:  Procedure Laterality Date  . CLEFT PALATE REPAIR    . TONSILLECTOMY      Social History  reports that he has quit smoking. He has never used smokeless tobacco. He reports current alcohol use. He reports current drug use. Drug: Marijuana.  No Known Allergies  Family History  Problem Relation Age of Onset  . Hypertension Mother   . Hypertension Father    Prior to Admission medications   Not on File    Physical Exam: Vitals:   03/13/21 2142 03/13/21 2145 03/13/21 2148 03/13/21 2200  BP:  (!) 173/112    Pulse:  (!) 138    Resp:  17    Temp:    98.7 F (37.1 C)  TempSrc:    Oral  SpO2: 90% 92%    Weight:   79.4 kg   Height:   5\' 7"  (1.702 m)    Physical Exam Constitutional:      General: He is not in acute distress.    Comments: drowsy  HENT:     Head: Normocephalic and atraumatic.     Mouth/Throat:     Mouth: Mucous membranes are moist.     Pharynx: Oropharynx is clear.  Eyes:     Extraocular Movements: Extraocular movements intact.  Pupils: Pupils are equal, round, and reactive to light.  Cardiovascular:     Rate and Rhythm: Regular rhythm. Tachycardia present.     Pulses: Normal pulses.     Heart sounds: Normal heart sounds.  Pulmonary:     Effort: Pulmonary effort is normal. No respiratory distress.     Breath sounds: Normal breath sounds.  Abdominal:     General: Bowel sounds are normal. There is no distension.     Palpations: Abdomen is soft.     Tenderness: There is no abdominal tenderness.  Musculoskeletal:        General: No swelling or deformity.  Skin:    General: Skin is warm and dry.  Neurological:     General: No focal deficit present.     Mental Status: Mental status is at baseline.    Labs on Admission: I have personally  reviewed following labs and imaging studies  CBC: Recent Labs  Lab 03/13/21 2148  WBC 18.4*  NEUTROABS 14.4*  HGB 13.9  HCT 41.0  MCV 90.9  PLT 414*    Basic Metabolic Panel: Recent Labs  Lab 03/13/21 2148  NA 139  K 4.0  CL 101  CO2 27  GLUCOSE 119*  BUN 14  CREATININE 1.28*  CALCIUM 9.0    GFR: Estimated Creatinine Clearance: 92.2 mL/min (A) (by C-G formula based on SCr of 1.28 mg/dL (H)).  Liver Function Tests: Recent Labs  Lab 03/13/21 2148  AST 66*  ALT 37  ALKPHOS 104  BILITOT 0.6  PROT 7.4  ALBUMIN 4.8    Urine analysis:    Component Value Date/Time   COLORURINE YELLOW (A) 03/13/2021 2246   APPEARANCEUR CLEAR (A) 03/13/2021 2246   LABSPEC 1.019 03/13/2021 2246   PHURINE 6.0 03/13/2021 2246   GLUCOSEU NEGATIVE 03/13/2021 2246   HGBUR NEGATIVE 03/13/2021 2246   BILIRUBINUR NEGATIVE 03/13/2021 2246   KETONESUR NEGATIVE 03/13/2021 2246   PROTEINUR NEGATIVE 03/13/2021 2246   NITRITE NEGATIVE 03/13/2021 2246   LEUKOCYTESUR NEGATIVE 03/13/2021 2246    Radiological Exams on Admission: DG Chest Portable 1 View  Result Date: 03/13/2021 CLINICAL DATA:  Altered mental status and diaphoresis. EXAM: PORTABLE CHEST 1 VIEW COMPARISON:  January 16, 2021 FINDINGS: The heart size and mediastinal contours are within normal limits. Both lungs are clear. The visualized skeletal structures are unremarkable. IMPRESSION: No active disease. Electronically Signed   By: Aram Candela M.D.   On: 03/13/2021 22:03   EKG: Independently reviewed.  Sinus tachycardia at 128 bpm.  Wandering baseline.  Some nonspecific ST changes.  Assessment/Plan Principal Problem:   Non-traumatic rhabdomyolysis Active Problems:   Drug overdose   Leukocytosis   AKI (acute kidney injury) (HCC)  Nontraumatic rhabdomyolysis > Patient with CK elevated to 2680 ED likely related to substance use/overdose. > Received 2 L of IV fluids in the ED  - Continue with 150 cc/h - Trend  CK  Unintentional overdose > Patient has history of polysubstance use and prior overdose. > States he was not trying to harm himself.  Likely has overdosed on opiates and possibly other substances with some suspicion for cocaine or methamphetamine considering his episodes of hyperactivity.  He was more appropriate on my exam and denies any methamphetamine or cocaine use. > Received dose of Ativan in the ED for episodes of agitation which was effective > Tachycardia improving in ED with IV fluids. - Follow-up UDS - Continue supportive care - Plan to redose Ativan if needed.  AKI > Creatinine elevated to  1.28 from baseline of around 0.9 > Received 2 L in the ED as above - Continue with IV fluids as above - Avoid nephrotoxic agents - Trend renal function and electrolytes  Leukocytosis > WBC elevated to 18.  Suspect reactive leukocytosis no signs of infection. - Continue to trend fever curve and white count.  DVT prophylaxis: Lovenox  Code Status:   Full  Family Communication:  Attempted to contact patient's father by phone at [phone], however there was no answer. Disposition Plan:   Patient is from:  Home  Anticipated DC to:  Home  Anticipated DC date:  1 to 2 days  Anticipated DC barriers: None  Consults called:  None  Admission status:  Observation, MedSurg with telemetry   Severity of Illness: The appropriate patient status for this patient is OBSERVATION. Observation status is judged to be reasonable and necessary in order to provide the required intensity of service to ensure the patient's safety. The patient's presenting symptoms, physical exam findings, and initial radiographic and laboratory data in the context of their medical condition is felt to place them at decreased risk for further clinical deterioration. Furthermore, it is anticipated that the patient will be medically stable for discharge from the hospital within 2 midnights of admission. The following factors  support the patient status of observation.   " The patient's presenting symptoms include altered mental status. " The physical exam findings include tachycardia, drowsiness. " The initial radiographic and laboratory data are creatinine elevated 1.28 from baseline of 0.9.  Glucose 118.  AST 66.  Leukocytosis to 18.  Platelets 414.  CK 2600.   Synetta Fail MD [facility]  How to contact the [facility] Attending or Consulting provider 7A - 7P or covering provider during after hours 7P -7A, for this patient?   1. Check the care team in [facility] and look for a) attending/consulting [facility] provider listed and b) the [facility] team listed 2. Log into [email or web address] and use [place]'s universal password to access. If you do not have the password, please contact the hospital operator. 3. Locate the [facility] provider you are looking for under [facility] and page to a number that you can be directly reached. 4. If you still have difficulty reaching the provider, please page the [facility] (Director on Call) for the Hospitalists listed on amion for assistance.  03/13/2021, 11:53 PM

## 2021-03-13 NOTE — ED Triage Notes (Signed)
Pt BIB by father for "not acting right."  Pt states he took 1 percocet.  Pt is disoriented in triage and extremely diaphoretic.

## 2021-03-14 DIAGNOSIS — M6282 Rhabdomyolysis: Secondary | ICD-10-CM | POA: Diagnosis not present

## 2021-03-14 LAB — BASIC METABOLIC PANEL
Anion gap: 8 (ref 5–15)
BUN: 13 mg/dL (ref 6–20)
CO2: 25 mmol/L (ref 22–32)
Calcium: 8.6 mg/dL — ABNORMAL LOW (ref 8.9–10.3)
Chloride: 107 mmol/L (ref 98–111)
Creatinine, Ser: 1.08 mg/dL (ref 0.61–1.24)
GFR, Estimated: >60 mL/min (ref >60)
Glucose, Bld: 111 mg/dL — ABNORMAL HIGH (ref 70–99)
Potassium: 3.5 mmol/L (ref 3.5–5.1)
Sodium: 140 mmol/L (ref 135–145)

## 2021-03-14 LAB — CBC
HCT: 37 % — ABNORMAL LOW (ref 39.0–52.0)
Hemoglobin: 12.3 g/dL — ABNORMAL LOW (ref 13.0–17.0)
MCH: 30.8 pg (ref 26.0–34.0)
MCHC: 33.2 g/dL (ref 30.0–36.0)
MCV: 92.5 fL (ref 80.0–100.0)
Platelets: 293 10*3/uL (ref 150–400)
RBC: 4 MIL/uL — ABNORMAL LOW (ref 4.22–5.81)
RDW: 12.7 % (ref 11.5–15.5)
WBC: 9.9 10*3/uL (ref 4.0–10.5)
nRBC: 0 % (ref 0.0–0.2)

## 2021-03-14 LAB — TROPONIN I (HIGH SENSITIVITY): Troponin I (High Sensitivity): 13 ng/L (ref <18)

## 2021-03-14 LAB — SARS CORONAVIRUS 2 (TAT 6-24 HRS): SARS Coronavirus 2: NEGATIVE

## 2021-03-14 LAB — CK: Total CK: 1590 U/L — ABNORMAL HIGH (ref 49–397)

## 2021-03-14 NOTE — Discharge Summary (Signed)
Physician Discharge Summary  Zachary Soto NTI:[id number] DOB: October 16, 2001 DOA: 03/13/2021  PCP: Patient, No Pcp Per (Inactive)  Admit date: 03/13/2021 Discharge date: 03/14/2021  Admitted From: Home Disposition: Home  Recommendations for Outpatient Follow-up:  1. Follow up with PCP in 1-2 weeks 2. Please obtain BMP/CBC in one week 3. Needs continued counseling on the substance abuse abstinence  Home Health: No Equipment/Devices: None  Discharge Condition: Stable CODE STATUS: Full code Diet recommendation: Regular diet  History of present illness:  Zachary Soto is a 20 year old male with past medical history significant for polysubstance abuse, history of unintentional overdose, depression who presented to the ED with confusion.  Patient was brought into the ED by his father for "not acting right".  While in the ED he was noted to be alternating between hyperactive (with associated jerking movements) and somnolence.  He reportedly told the staff in the ED that "I am okay" and eventually endorsed taking "1 Percocet".  Then he told the admitting physician that he took maybe took 1-2 Percocets.  Patient denies any intention to harm self.  Denies any other illicit substance abuse.  He reports uses marijuana at times but denies cocaine, methamphetamine use.  Denies fevers, chills, chest pain, shortness of breath, abdominal pain, constipation, diarrhea, nausea/vomiting.  Review of EMR, patient was previously admitted for overdose and had more significant overdose in February requiring ICU admission on Narcan drip, at that time he also reported taking "1 Percocet".  In the ED, HR 130s, BP 173/112, RR 17, SPO2 92% on room air. Lab work-up showed CMP with creatinine of 1.28 up from baseline around 0.9.  Glucose 119.  AST 66.  CBC showed leukocytosis to 18, platelets 414.  CK elevated to 2600.  Troponin normal with repeat pending.  Respiratory panel for flu and COVID pending.   Ethanol level, Tylenol level, aspirin level normal.  Urinalysis normal.  UDS pending.  Chest x-ray without acute disease.  Patient received 2 L of IV fluids and dose of Ativan in the ED. Hospital service consulted for further evaluation and management of acute metabolic encephalopathy likely secondary to unintentional drug overdose.    Hospital course:   Nontraumatic rhabdomyolysis Patient presented with a CK level elevated to 2680 likely related to substance use/overdose.  Received 2 L IV fluids in the ED followed by continuous IV fluid hydration.  CK level improved and was 1590 at time of discharge.  Patient without any significant muscle aches, ambulating without issue.  Discussed need for maintaining of hydration.  Unintentional overdose Patient has history of polysubstance use and prior overdose. States he was not trying to harm himself.  Likely has overdosed on opiates.  UDS positive for THC.  Discussed with patient need for complete cessation of illicit substances as well as narcotics that are not prescribed to him.  Acute renal failure: Resolved Creatinine elevated to 1.28 from baseline of around 0.9, Received 2 L in the ED followed by continuous IV fluid hydration.  Follow-up creatinine 1.08 at time of discharge.  Leukocytosis: Resolved WBC elevated to 18.  Suspect reactive leukocytosis given no source of infection.  WBC count improved to 9.9 at time of discharge.   Discharge Diagnoses:  Principal Problem:   Non-traumatic rhabdomyolysis Active Problems:   Drug overdose    Discharge Instructions  Discharge Instructions    Call MD for:  difficulty breathing, headache or visual disturbances   Complete by: As directed    Call MD for:  extreme fatigue  Complete by: As directed    Call MD for:  persistant dizziness or light-headedness   Complete by: As directed    Call MD for:  persistant nausea and vomiting   Complete by: As directed    Call MD for:  severe uncontrolled  pain   Complete by: As directed    Call MD for:  temperature >100.4   Complete by: As directed    Diet - low sodium heart healthy   Complete by: As directed    Increase activity slowly   Complete by: As directed      Allergies as of 03/14/2021   No Known Allergies     Medication List    You have not been prescribed any medications.     No Known Allergies  Consultations:  None   Procedures/Studies: DG Chest Portable 1 View  Result Date: 03/13/2021 CLINICAL DATA:  Altered mental status and diaphoresis. EXAM: PORTABLE CHEST 1 VIEW COMPARISON:  January 16, 2021 FINDINGS: The heart size and mediastinal contours are within normal limits. Both lungs are clear. The visualized skeletal structures are unremarkable. IMPRESSION: No active disease. Electronically Signed   By: Aram Candelahaddeus  Houston M.D.   On: 03/13/2021 22:03      Subjective: Patient seen and examined bedside, resting comfortably.  Sleeping but easily arousable.  Alert and oriented.  When questioned about events precipitating hospitalization, patient unclear.  Discussed with patient's father via telephone this morning that patient is now stable for discharge home and needs to abstain from illicit drugs as well as prescription narcotics which he is not prescribed.  No other questions or concerns at this time.  Denies headache, no visual changes, no chest pain, no palpitations, no shortness of breath, no abdominal pain, no weakness, no fatigue, no myalgias, no paresthesias.  No acute events overnight per nursing staff.  Discharge Exam: Vitals:   03/14/21 0156 03/14/21 0247  BP: 137/87 137/90  Pulse: 74 78  Resp: 14 16  Temp:  98.1 F (36.7 C)  SpO2: 99% 100%   Vitals:   03/13/21 2200 03/13/21 2330 03/14/21 0156 03/14/21 0247  BP:  134/87 137/87 137/90  Pulse:  (!) 101 74 78  Resp:  13 14 16   Temp: 98.7 F (37.1 C)   98.1 F (36.7 C)  TempSrc: Oral   Oral  SpO2:  100% 99% 100%  Weight:      Height:         General: Pt is alert, awake, not in acute distress Cardiovascular: RRR, S1/S2 +, no rubs, no gallops Respiratory: CTA bilaterally, no wheezing, no rhonchi, on room air Abdominal: Soft, NT, ND, bowel sounds + Extremities: no edema, no cyanosis    The results of significant diagnostics from this hospitalization (including imaging, microbiology, ancillary and laboratory) are listed below for reference.     Microbiology: Recent Results (from the past 240 hour(s))  SARS CORONAVIRUS 2 (TAT 6-24 HRS) Nasopharyngeal Nasopharyngeal Swab     Status: None   Collection Time: 03/13/21 11:58 PM   Specimen: Nasopharyngeal Swab  Result Value Ref Range Status   SARS Coronavirus 2 NEGATIVE NEGATIVE Final    Comment: (NOTE) SARS-CoV-2 target nucleic acids are NOT DETECTED.  The SARS-CoV-2 RNA is generally detectable in upper and lower respiratory specimens during the acute phase of infection. Negative results do not preclude SARS-CoV-2 infection, do not rule out co-infections with other pathogens, and should not be used as the sole basis for treatment or other patient management decisions. Negative results must  be combined with clinical observations, patient history, and epidemiological information. The expected result is Negative.  Fact Sheet for Patients: HairSlick.no  Fact Sheet for Healthcare Providers: quierodirigir.com  This test is not yet approved or cleared by the [place] FDA and  has been authorized for detection and/or diagnosis of SARS-CoV-2 by FDA under an Emergency Use Authorization (EUA). This EUA will remain  in effect (meaning this test can be used) for the duration of the COVID-19 declaration under Se ction 564(b)(1) of the Act, 21 U.S.C. section 360bbb-3(b)(1), unless the authorization is terminated or revoked sooner.  Performed at [facility], [address] [place], [place] [address]       Labs: BNP (last 3 results) No results for input(s): BNP in the last 8760 hours. Basic Metabolic Panel: Recent Labs  Lab 03/13/21 2148 03/14/21 0541  NA 139 140  K 4.0 3.5  CL 101 107  CO2 27 25  GLUCOSE 119* 111*  BUN 14 13  CREATININE 1.28* 1.08  CALCIUM 9.0 8.6*   Liver Function Tests: Recent Labs  Lab 03/13/21 2148  AST 66*  ALT 37  ALKPHOS 104  BILITOT 0.6  PROT 7.4  ALBUMIN 4.8   No results for input(s): LIPASE, AMYLASE in the last 168 hours. No results for input(s): AMMONIA in the last 168 hours. CBC: Recent Labs  Lab 03/13/21 2148 03/14/21 0541  WBC 18.4* 9.9  NEUTROABS 14.4*  --   HGB 13.9 12.3*  HCT 41.0 37.0*  MCV 90.9 92.5  PLT 414* 293   Cardiac Enzymes: Recent Labs  Lab 03/13/21 2148 03/14/21 0541  CKTOTAL 2,604* 1,590*   BNP: Invalid input(s): POCBNP CBG: No results for input(s): GLUCAP in the last 168 hours. D-Dimer No results for input(s): DDIMER in the last 72 hours. Hgb A1c No results for input(s): HGBA1C in the last 72 hours. Lipid Profile No results for input(s): CHOL, HDL, LDLCALC, TRIG, CHOLHDL, LDLDIRECT in the last 72 hours. Thyroid function studies No results for input(s): TSH, T4TOTAL, T3FREE, THYROIDAB in the last 72 hours.  Invalid input(s): FREET3 Anemia work up No results for input(s): VITAMINB12, FOLATE, FERRITIN, TIBC, IRON, RETICCTPCT in the last 72 hours. Urinalysis    Component Value Date/Time   COLORURINE YELLOW (A) 03/13/2021 2246   APPEARANCEUR CLEAR (A) 03/13/2021 2246   LABSPEC 1.019 03/13/2021 2246   PHURINE 6.0 03/13/2021 2246   GLUCOSEU NEGATIVE 03/13/2021 2246   HGBUR NEGATIVE 03/13/2021 2246   BILIRUBINUR NEGATIVE 03/13/2021 2246   KETONESUR NEGATIVE 03/13/2021 2246   PROTEINUR NEGATIVE 03/13/2021 2246   NITRITE NEGATIVE 03/13/2021 2246   LEUKOCYTESUR NEGATIVE 03/13/2021 2246   Sepsis Labs Invalid input(s): PROCALCITONIN,  WBC,  LACTICIDVEN Microbiology Recent Results (from the past 240  hour(s))  SARS CORONAVIRUS 2 (TAT 6-24 HRS) Nasopharyngeal Nasopharyngeal Swab     Status: None   Collection Time: 03/13/21 11:58 PM   Specimen: Nasopharyngeal Swab  Result Value Ref Range Status   SARS Coronavirus 2 NEGATIVE NEGATIVE Final    Comment: (NOTE) SARS-CoV-2 target nucleic acids are NOT DETECTED.  The SARS-CoV-2 RNA is generally detectable in upper and lower respiratory specimens during the acute phase of infection. Negative results do not preclude SARS-CoV-2 infection, do not rule out co-infections with other pathogens, and should not be used as the sole basis for treatment or other patient management decisions. Negative results must be combined with clinical observations, patient history, and epidemiological information. The expected result is Negative.  Fact Sheet for Patients: HairSlick.no  Fact Sheet for Healthcare Providers: quierodirigir.com  This test is not yet approved or cleared by the [place] FDA and  has been authorized for detection and/or diagnosis of SARS-CoV-2 by FDA under an Emergency Use Authorization (EUA). This EUA will remain  in effect (meaning this test can be used) for the duration of the COVID-19 declaration under Se ction 564(b)(1) of the Act, 21 U.S.C. section 360bbb-3(b)(1), unless the authorization is terminated or revoked sooner.  Performed at [facility], [address] [place], [place] [address]      Time coordinating discharge: Over 30 minutes  SIGNED:   Alvira Philips [place], DO  [facility] 03/14/2021, 9:44 AM

## 2021-03-14 NOTE — Discharge Instructions (Signed)
Prescription Drug Misuse Information Prescription drug misuse happens when a medicine is used in a way that is different from how it was prescribed by the health care provider (non-medical reason). This includes:  Taking more of the medicine than is prescribed.  Taking another person's medicine.  Taking the medicine for another reason. The most common types of misused medicines are painkillers (such as opiates), depressants (tranquilizers and anti-anxiety drugs), and stimulants (amphetamines). Prescription drug misuse can cause:  Addiction.  Dangerous side effects.  Overdose, which can be deadly. How can drug misuse affect me? You may become addicted Misusing prescription medicine can lead to addiction. Addiction includes:  Having a need for the medicine, even though it is affecting your life.  Needing more and more of the medicine to get the same effect (tolerance).  Feeling sick when you do not take the medicine (withdrawal). Misusing prescription medicine may require treatment for addiction. You may put your health in danger Misusing prescription medicines can put your health in danger. Misuse can:  Affect other medicines that you take and result in dangerous side effects.  Make you more likely to misuse other drugs, like tobacco, alcohol, and marijuana.  Cause you to make poor decisions and engage in risky behavior. This can lead to problems at home, school, or work.  Cause you to overdose.  Affect your overall health. Misuse affects a pregnant woman's health and the health of her unborn baby. A baby can also be born with drug addiction and withdrawal symptoms. What are the effects of misusing medicines? The effects of misusing prescription medicines depend on the type of medicine you are misusing. They include: Opiates  Confusion.  Nausea.  Itching.  Difficulty passing stool (constipation).  A flu-like sickness during withdrawal.  An inability to breathe if an  overdose occurs (respiratory depression). Depressants  Sleepiness.  Confusion.  Seizures if the medicine is stopped. Stimulants  Difficulty falling asleep or staying asleep (insomnia).  Loss of appetite.  Anxiety.  False fears or beliefs (paranoia).  Fatigue and depression during withdrawal.  Overdose. This can cause a dangerous high body temperature, heart failure, or a seizure. What actions can I take to prevent drug misuse? To prevent drug misuse:  Do not take another person's medicines.  Do not take a prescription medicine without a legal prescription.  Do not take more of a medicine than was prescribed.  Do not take a medicine in a different way than it was prescribed, such as snorting, crushing, or injecting.  Do not take a medicine to feel high, relaxed, or energized.  Do not take a stimulant to lose weight or improve mental alertness. Where to find more information  [employer] on Drug Abuse: http://gonzalez-rivas.net/  Foundation for a Drug-Free World: drugfreeworld.org  Substance Abuse and Mental Health Services Administration: RockToxic.pl Contact a health care provider if you:  Are misusing a prescription medicine and need help stopping.  Are pregnant and misusing a prescription medicine.  Need more of a medicine to get the same effects.  Have withdrawal symptoms if you try to stop taking the medicine.  Are unable to stop misusing a prescription medicine even though it is affecting your life. Summary  Prescription drug misuse happens when a medicine is used in a way that is different from how it was prescribed by the health care provider (non-medical reason).  The most common types of misused medicine are painkillers, depressants, and stimulants.  Misusing a prescription medicine can cause you to become addicted and  may put your health in danger.  Contact a health care provider if you are misusing medicines and need help stopping. This information is  not intended to replace advice given to you by your health care provider. Make sure you discuss any questions you have with your health care provider. Document Revised: 08/16/2019 Document Reviewed: 03/09/2018 Elsevier Patient Education  2021 [employer].   Illegal Drug Use Information, Adult Illegal drugs are chemicals and substances that are illegal to use, sell, or have (possess). Health care providers and pharmacies do not use or carry these types of drugs to treat medical problems because they can cause serious side effects and can lead to death. Examples of illegal drugs include:  Cocaine or crack.  Meth (methamphetamine) or crystal meth.  "Bath salts" (synthetic cathinones).  Heroin.  LSD, PCP, or acid.  Ecstasy. What is drug dependence? Using illegal drugs often leads to dependence or addiction. When you use certain drugs over a long period of time, your brain chemistry changes so that you can no longer function normally without that drug. This is called drug dependence. Drug dependence can cause you to:  Have unpleasant feelings and physical problems when you stop using the drug (withdrawal).  Be unable to perform at work or at home, or do the activities you used to do, without using the drug. Some drugs make people feel so good that they want to use the drug again and again. This is called drug addiction. People who are addicted spend a lot of time seeking out the drug so that they can get the feeling they want from it. Addiction and dependence can be very hard to overcome. How can illegal drug use and dependence affect me? Using an illegal drug only once can have a major impact on your life. It is possible to die from side effects after using a drug just once. If you use an illegal drug repeatedly, you may need to take larger and larger doses of the drug to experience the feelings you want. Drug dependency and addiction may lead to:  Being unable to care for yourself and  others.  Withdrawal, if you stop using the drug.  Negative effects on your relationships and work performance. It causes others not to trust you. If you have children, you could lose custody of them.  Behaving in ways that do not match your values.  Lying and crime, such as stealing.  Jail or prison. This can affect your ability to find a good job or continue your education.  Health problems such as tooth loss, skin problems, heart and lung disease, and stomach problems.  If you are a woman, you may have problems with pregnancy, including: ? Losing the pregnancy early (miscarriage), early delivery (premature birth), or delivering a lifeless infant (stillbirth). ? Slow or abnormal growth (birth defects) of your unborn child. ? Giving birth to a newborn who is addicted to illegal drugs.  A drug overdose. This is a dangerous situation that requires hospitalization and often leads to death. What are the benefits of avoiding illegal drug use? Avoiding illegal drug use can:  Keep your mind and body healthy. This can help improve work performance and participation in activities.  Keep you from developing drug dependency or addiction. This can help you avoid negative side effects such as withdrawal and overdose.  Help you have healthy relationships with your friends and family.  Help you have more stable finances. Instead of using your money for drugs, you can: ?  Spend it on things you would like to have. ? Save it for future use, such as for retirement or for big purchases like a car or a house.  Help you avoid a permanent criminal record, jail time, or prison time. Having a clean record allows you to have more job and educational opportunities in the future. What actions can be taken? To avoid using illegal drugs:  Find healthy ways to cope with stress, such as exercise, meditation, or spending time with family and friends. Talk with your health care provider about how you feel and how  to cope with stress.  Spend time with people who do not use illegal drugs, or make new friends who do not use drugs.  Do something else instead of using drugs. You can exercise, take up a hobby, or participate in activities that you can do with others.  Do not be afraid to say no if someone offers you an illegal drug. Speak up about why you do not want to use drugs. You can be a positive role model for others.  Work with a health care provider or counselor to create a program for yourself to help you deal with various aspects of your addiction.   Where to find more information You can find more information about illegal drug use, dependence, and addiction from:  Your health care provider or mental health counselor.  Narcotics Anonymous: www.na.org  Substance Abuse and Mental Health Services Administration: ? Treatment finder: IdentityList.se ? National helpline: 1-800-662-HELP (386)579-3997) Contact a health care provider if:  You use illegal drugs.  You have missed family activities or work to use illegal drugs.  You have lied or stolen to get illegal drugs.  You lose interest in things you used to enjoy, like hobbies or family activities.  Your eating or sleeping habits change as a result of drug use.  You use medicine to get the same effects as a drug. This is illegal use and can become addictive as well.  You stopped illegal drug use previously and you start actively using it again (relapse).  You want help to change your addictive behavior. Get help right away if:  You have thoughts about hurting yourself or others. If you ever feel like you may hurt yourself or others, or have thoughts about taking your own life, get help right away. You can go to your nearest emergency department or call:  Your local emergency services (911 in the [place]  A suicide crisis helpline, such as the National Suicide Prevention Lifeline at (818)761-7279. This is open 24 hours a  day. Summary  Illegal drugs are chemicals and substances that are illegal to use, sell, or possess.  Using illegal drugs often leads to dependence or addiction. This means that you need the drug to feel normal.  If you use illegal drugs, look for resources to help you quit and find healthy ways to cope with stress. This information is not intended to replace advice given to you by your health care provider. Make sure you discuss any questions you have with your health care provider. Document Revised: 08/16/2019 Document Reviewed: 05/19/2017 Elsevier Patient Education  2021 Elsevier Inc.   Substance Use Disorder Substance use disorder occurs when a person's repeated use of drugs or alcohol interferes with his or her ability to be productive. This disorder can cause problems with mental and physical health. It can affect your ability to have healthy relationships, and it can keep you from being able to meet your  responsibilities at work, home, or school. It can also lead to addiction, which is a condition in which the person cannot stop using the substance consistently for a period of time. Addiction changes the way the brain works. Because of these changes, addiction is a chronic condition. Substance use disorder can be mild, moderate, or severe. The most commonly abused substances include:  Alcohol.  Tobacco.  Marijuana.  Stimulants, such as cocaine and methamphetamine.  Hallucinogens, such as LSD and PCP.  Opioids, such as some prescription pain medicines and heroin. What are the causes? This condition may develop due to many complex social, psychological, or physical reasons, such as:  Stress.  Abuse.  Peer pressure.  Anxiety or depression. What increases the risk? This condition is more likely to develop in people who:  Use substances to cope with stress.  Have been abused.  Have a mental health disorder, such as depression.  Have a family history of substance use  disorder. What are the signs or symptoms? Symptoms of this condition include:  Using the substance for longer periods of time or at a higher dosage than what is normal or intended.  Having a lasting desire to use the substance.  Being unable to slow down or stop the use of the substance.  Spending an abnormal amount of time getting the substance, using the substance, or recovering from using the substance.  Using the substance in a way that interferes with work, school, social activities, and personal relationships.  Using the substance even after having negative consequences, such as: ? Health problems. ? Legal or financial troubles. ? Job loss. ? Relationship problems.  Needing more and more of the substance to get the same effect (developing tolerance).  Experiencing unpleasant symptoms if you do not use the substance (withdrawal).  Using the substance to avoid withdrawal symptoms. How is this diagnosed? This condition may be diagnosed based on:  A physical exam.  Your history of substance use.  Your symptoms. This includes: ? How substance use affects your life. ? Changes in personality, behaviors, and mood. ? Having at least two symptoms of substance use disorder within a 8-month period. ? Health issues related to substance use, such as liver damage, shortness of breath, fatigue, cough, or heart problems.  Blood or urine tests to screen for alcohol and drugs. How is this treated? This condition may be treated by:  Stopping substance use safely. This may require taking medicines and being closely monitored for several days.  Taking part in group and individual counseling from mental health providers who help people with substance use disorder.  Staying at a live-in (residential) treatment center for several days or weeks.  Attending daily counseling sessions at a treatment center.  Taking medicine as told by your health care provider: ? To ease symptoms and  prevent complications during withdrawal. ? To treat other mental health issues, such as depression or anxiety. ? To block cravings by causing the same effects as the substance. ? To block the effects of the substance or replace good sensations with unpleasant ones.  Participating in a support group to share your experience with others who are going through the same thing. These groups are an important part of long-term recovery for many people. Recovery can be a long process. Many people who undergo treatment start using the substance again after stopping (relapse). If you relapse, that does not mean that treatment will not work.   Follow these instructions at home:  Take over-the-counter and prescription  medicines only as told by your health care provider.  Do not use any drugs or alcohol.  Avoid temptations or triggers that you associate with your use of the substance.  Learn and practice techniques for managing stress.  Have a plan for vulnerable moments. Get phone numbers of people who are willing to help and who are committed to your recovery.  Attend support groups on a regular basis. These groups include 12-step programs like Alcoholics Anonymous and Narcotics Anonymous.  Keep all follow-up visits as told by your health care providers. This is important. This includes continuing to work with therapists and support groups.   Contact a health care provider if:  You cannot take your medicines as told.  Your symptoms get worse.  You have trouble resisting the urge to use drugs or alcohol. Get help right away if you:  Relapse.  Think that you may have taken too much of a drug. The hotline of the [facility] is (325)282-8380(800) (805)582-8729.  Have signs of an overdose. Symptoms include: ? Chest pain. ? Confusion. ? Sleepiness or difficulty staying awake. ? Slowed breathing. ? Nausea or vomiting. ? A seizure.  Have serious thoughts about hurting yourself or someone  else. Drug overdose is an emergency. Do not wait to see if the symptoms will go away. Get medical help right away. Call your local emergency services (911 in the [place] Do not drive yourself to the hospital. If you ever feel like you may hurt yourself or others, or have thoughts about taking your own life, get help right away. You can go to your nearest emergency department or call:  Your local emergency services (911 in the [place]  A suicide crisis helpline, such as the National Suicide Prevention Lifeline at 613-234-42651-(952)612-9440. This is open 24 hours a day. Summary  Substance use disorder occurs when a person's repeated use of drugs or alcohol interferes with his or her ability to be productive.  Taking part in group and individual counseling from mental health providers is a common treatment for people with substance use disorder.  Recovery can be a long process. Many people who undergo treatment start using the substance again after stopping (relapse). A relapse does not mean that treatment will not work.  Attend support groups such as Alcoholics Anonymous and Narcotics Anonymous. These groups are an important part of long-term recovery for many people. This information is not intended to replace advice given to you by your health care provider. Make sure you discuss any questions you have with your health care provider. Document Revised: 03/16/2019 Document Reviewed: 01/04/2018 Elsevier Patient Education  2021 Elsevier Inc.   Rhabdomyolysis Rhabdomyolysis is a condition that happens when muscle cells break down and release substances into the blood that can damage the kidneys. This condition happens because of damage to the muscles that move bones (skeletal muscles). When the skeletal muscles are damaged, substances inside the muscle cells go into the blood. One of these substances is a protein called myoglobin. Large amounts of myoglobin can cause kidney damage or kidney failure. Other  substances that are released by muscle cells may upset the balance of the minerals (electrolytes) in the blood. This imbalance causes the blood to have too much acid (acidosis). What are the causes? This condition is caused by muscle damage. There are common causes and possible causes. Common causes of the condition Common causes of muscle damage include:  Using the muscles too much.  Getting an injury that crushes or squeezes  a muscle too tightly.  Using drugs, mainly cocaine.  Drinking too much alcohol. Possible causes of the condition Other possible causes of this condition include:  Some prescription medicines, such as: ? Medicines that lower cholesterol (statins). ? Amphetamines, which are used to treat attention deficit hyperactivity disorder (ADHD) or to help with weight loss. ? Certain pain medicines (opiates).  Infections.  Muscle diseases that are passed down from parent to child (inherited).  High fever.  Heatstroke.  Dehydration.  Seizures.  Surgery. What increases the risk? The following factors may make you more likely to develop this condition:  A family history of muscle disease.  Taking part in extreme sports, such as running in marathons.  Diabetes.  Being an older adult.  Heavy drug or alcohol use. What are the signs or symptoms? A person can have the symptoms of rhabdomyolysis or complications that come from the symptoms. Symptoms of the disease Symptoms of this condition vary. Some people have very few symptoms and other people have many symptoms.  Common symptoms include:  Muscle pain and swelling.  Weak muscles.  Dark urine.  Feeling weak and tired. Other symptoms include:  Nausea and vomiting.  A fever.  Pain in the abdomen.  Pain in the joints. Complications from the disease Complications from this condition include:  A heart rhythm that is not normal (arrhythmia).  Seizures.  Not urinating enough because of kidney  failure.  Very low blood pressure (hypotension) and shock. Signs of shock include dizziness, blurry vision, and clammy skin.  Bleeding that is hard to stop or control.  Compartment syndrome. This is when there is too much pressure in the space surrounding muscles. How is this diagnosed? This condition is diagnosed based on:  Your symptoms and medical history.  A physical exam.  Tests, including: ? Blood tests. ? Urine tests to check for myoglobin. You may also have other tests to check for causes of muscle damage and to check for complications. How is this treated? This condition may be treated with:  Fluids and medicines given through an IV that is inserted into one of your veins.  Medicines to lower acidosis or to restore the balance of electrolytes in your blood.  Hemodialysis. This treatment uses an artificial kidney machine to filter your blood while you recover. You may have this if other treatments are not helping.   Follow these instructions at home: Activity  Rest at home as told by your health care provider.  Return to your normal activities as told by your health care provider. Ask your health care provider what activities are safe for you.  Do not do activities that take a lot of effort. Ask your health care provider what level of exercise is safe for you. Alcohol use  Do not drink alcohol if: ? Your health care provider tells you not to drink. ? You are pregnant, may be pregnant, or are planning to become pregnant.  If you drink alcohol: ? Limit how much you use to:  0-1 drink a day for women.  0-2 drinks a day for men. ? Be aware of how much alcohol is in your drink. In the [place] one drink equals one 12 oz bottle of beer (355 mL), one 5 oz glass of wine (148 mL), or one 1 oz glass of hard liquor (44 mL). General instructions  Take over-the-counter and prescription medicines only as told by your health care provider.  Drink enough fluid to keep your  urine pale yellow.  Do not use drugs.  If you are having problems with drug or alcohol use, ask your health care provider for help.  Keep all follow-up visits as told by your health care provider. This is important.   Contact a health care provider if:  You start having symptoms of this condition after treatment. Get help right away if you:  Have a seizure.  Bleed easily or cannot control bleeding.  Cannot urinate.  Have chest pain.  Have trouble breathing. These symptoms may represent a serious problem that is an emergency. Do not wait to see if the symptoms will go away. Get medical help right away. Call your local emergency services (911 in the [place] Do not drive yourself to the hospital.  Summary  Rhabdomyolysis is a condition that happens when muscle cells break down and release substances into the blood that can damage the kidneys.  This condition happens because of damage to the muscles that move bones (skeletal muscle). It can also be caused by too much alcohol and drug use.  Treatment may include fluids, medicines, and helping the kidneys filter blood (hemodialysis).  Contact your health care provider if you start having symptoms of this condition after treatment.  Get help right away if you have a seizure, chest pain, or trouble breathing, or if you bleed easily, cannot control bleeding, or cannot urinate. This information is not intended to replace advice given to you by your health care provider. Make sure you discuss any questions you have with your health care provider. Document Revised: 08/31/2019 Document Reviewed: 08/23/2019 Elsevier Patient Education  2021 [employer].

## 2021-03-14 NOTE — Plan of Care (Signed)
Patient declined to wait for discharge paperwork, removed IV, and walked out.  Dr. [place] notified.

## 2021-03-14 NOTE — ED Notes (Signed)
Pt refusing covid swab

## 2021-03-18 ENCOUNTER — Emergency Department (HOSPITAL_COMMUNITY): Payer: Medicare Other

## 2021-03-18 ENCOUNTER — Encounter (HOSPITAL_COMMUNITY): Payer: Self-pay

## 2021-03-18 ENCOUNTER — Emergency Department (HOSPITAL_COMMUNITY)
Admission: EM | Admit: 2021-03-18 | Discharge: 2021-03-18 | Disposition: A | Discharge status: Home / Self Care | Payer: Medicare Other | Attending: Emergency Medicine | Admitting: Emergency Medicine

## 2021-03-18 ENCOUNTER — Other Ambulatory Visit: Payer: Self-pay

## 2021-03-18 DIAGNOSIS — H538 Other visual disturbances: Secondary | ICD-10-CM | POA: Diagnosis present

## 2021-03-18 DIAGNOSIS — Z87891 Personal history of nicotine dependence: Secondary | ICD-10-CM | POA: Diagnosis not present

## 2021-03-18 DIAGNOSIS — E876 Hypokalemia: Secondary | ICD-10-CM | POA: Insufficient documentation

## 2021-03-18 LAB — BASIC METABOLIC PANEL
Anion gap: 9 (ref 5–15)
BUN: 8 mg/dL (ref 6–20)
CO2: 28 mmol/L (ref 22–32)
Calcium: 9.6 mg/dL (ref 8.9–10.3)
Chloride: 100 mmol/L (ref 98–111)
Creatinine, Ser: 0.86 mg/dL (ref 0.61–1.24)
GFR, Estimated: >60 mL/min (ref >60)
Glucose, Bld: 99 mg/dL (ref 70–99)
Potassium: 3.2 mmol/L — ABNORMAL LOW (ref 3.5–5.1)
Sodium: 137 mmol/L (ref 135–145)

## 2021-03-18 LAB — CBC WITH DIFFERENTIAL/PLATELET
Abs Immature Granulocytes: 0.04 10*3/uL (ref 0.00–0.07)
Basophils Absolute: 0 10*3/uL (ref 0.0–0.1)
Basophils Relative: 0 %
Eosinophils Absolute: 0 10*3/uL (ref 0.0–0.5)
Eosinophils Relative: 0 %
HCT: 41.4 % (ref 39.0–52.0)
Hemoglobin: 13.9 g/dL (ref 13.0–17.0)
Immature Granulocytes: 0 %
Lymphocytes Relative: 19 %
Lymphs Abs: 1.8 10*3/uL (ref 0.7–4.0)
MCH: 30.9 pg (ref 26.0–34.0)
MCHC: 33.6 g/dL (ref 30.0–36.0)
MCV: 92 fL (ref 80.0–100.0)
Monocytes Absolute: 0.8 10*3/uL (ref 0.1–1.0)
Monocytes Relative: 9 %
Neutro Abs: 7.1 10*3/uL (ref 1.7–7.7)
Neutrophils Relative %: 72 %
Platelets: 305 10*3/uL (ref 150–400)
RBC: 4.5 MIL/uL (ref 4.22–5.81)
RDW: 12.3 % (ref 11.5–15.5)
WBC: 9.9 10*3/uL (ref 4.0–10.5)
nRBC: 0 % (ref 0.0–0.2)

## 2021-03-18 LAB — RAPID URINE DRUG SCREEN, HOSP PERFORMED
Amphetamines: NOT DETECTED
Barbiturates: NOT DETECTED
Benzodiazepines: NOT DETECTED
Cocaine: NOT DETECTED
Opiates: NOT DETECTED
Tetrahydrocannabinol: POSITIVE — AB

## 2021-03-18 LAB — URINALYSIS, ROUTINE W REFLEX MICROSCOPIC
Bilirubin Urine: NEGATIVE
Glucose, UA: NEGATIVE mg/dL
Hgb urine dipstick: NEGATIVE
Ketones, ur: NEGATIVE mg/dL
Leukocytes,Ua: NEGATIVE
Nitrite: NEGATIVE
Protein, ur: NEGATIVE mg/dL
Specific Gravity, Urine: 1.002 — ABNORMAL LOW (ref 1.005–1.030)
pH: 7 (ref 5.0–8.0)

## 2021-03-18 LAB — CBG MONITORING, ED: Glucose-Capillary: 112 mg/dL — ABNORMAL HIGH (ref 70–99)

## 2021-03-18 LAB — CK: Total CK: 347 U/L (ref 49–397)

## 2021-03-18 NOTE — ED Notes (Signed)
Urine culture sent down with u/a 

## 2021-03-18 NOTE — ED Triage Notes (Signed)
Emergency Medicine Provider Triage Evaluation Note  Zachary Soto , a 20 y.o. male  was evaluated in triage.  Pt complains of bilateral blurry vision x1 week.  States it came on suddenly, denies any eye pain, photophobia, tinnitus or trauma area.  Denies headaches, paresthesias or weakness upper lower extremities.  States she never had this in past.  Is post where glasses does not wear any.  Review of Systems  Positive: Blurry vision, clear discharge from eyes Negative: Denies headaches, paresthesias or weakness in upper or lower extremities.  Physical Exam  BP (!) 146/113 (BP Location: Left Arm)   Pulse (!) 101   Temp 98.2 F (36.8 C) (Oral)   Resp 18   Ht 5\' 6"  (1.676 m)   Wt 68 kg   SpO2 98%   BMI 24.21 kg/m  Gen:   Awake, no distress   HEENT:  Atraumatic  Resp:  Normal effort  Cardiac:  Normal rate  MSK:   Moves extremities without difficulty Neuro:  Cranial nerves fully intact, coordination intact in upper extremities with finger-to-nose, normal gait  Medical Decision Making  Medically screening exam initiated at 5:55 PM.  Appropriate orders placed.  [name] was informed that the remainder of the evaluation will be completed by another provider, this initial triage assessment does not replace that evaluation, and the importance of remaining in the ED until their evaluation is complete.  Clinical Impression  Patient has blurry vision with clear discharge, patient will need further work-up here in the ED   Claudette Head, PA-C 03/18/21 1757

## 2021-03-18 NOTE — ED Notes (Signed)
Patient transported to CT 

## 2021-03-18 NOTE — ED Triage Notes (Signed)
Pt c/o blurred vision x 1 week. Pt denies nausea, vomiting, dizziness or pain.

## 2021-03-18 NOTE — ED Provider Notes (Signed)
[facility] EMERGENCY DEPARTMENT Provider Note   CSN: [id number] Arrival date & time: 03/18/21  1645     History Chief Complaint  Patient presents with  . Blurred Vision    Zachary Soto Zachary Soto is a 20 y.o. male.  HPI Patient is 20 year old male presenting today with complaints of blurry vision for 1 week.  Notably patient was admitted in this hospital 1 week ago given that he had overdosed on Percocets.  He denies any IV drug use ever.  He states that his only recreational drugs that he uses are marijuana and Percocets.  He states that he has had bilateral blurry vision for 1 week he states that he feels that he has a very slight film over his eyes and is unable to see quite as clearly as he normally does.  He states that he does feel that he can still get around without difficulty.  He feels that he knows it primarily when he is trying to read from distance.  He was prescribed glasses when he was a child but never use them.  He states he has not had any eye evaluations since he was a child.  Denies any numbness, weakness, slurred speech, confusion, seizures, syncope, chest pain, short of breath, lightheadedness, dizziness.  No floaters in his vision.  He denies any history of retinal detachment.  No head or eye trauma.  Denies any eye pain, redness or swelling.  Denies any other associate symptoms.  No aggravating mitigating factors.    Past Medical History:  Diagnosis Date  . Back pain   . Scoliosis     Patient Active Problem List   Diagnosis Date Noted  . Non-traumatic rhabdomyolysis 03/13/2021  . Drug overdose 01/16/2021  . 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 CLEFT PALATE REPAIR    . TONSILLECTOMY         Family History  Problem Relation Age of Onset  . Hypertension Mother   . Hypertension Father     Social History   Tobacco Use  . Smoking status: Former Games developer  . Smokeless tobacco: Never Used   Vaping Use  . Vaping Use: Never used  Substance Use Topics  . Alcohol use: Yes    Comment: "blunts this morning"  . Drug use: Yes    Types: Marijuana, Oxycodone    Home Medications Prior to Admission medications   Not on File    Allergies    Patient has no known allergies.  Review of Systems   Review of Systems  Constitutional: Negative for chills and fever.  HENT: Negative for congestion.   Eyes: Positive for visual disturbance.  Respiratory: Negative for shortness of breath.   Cardiovascular: Negative for chest pain.  Gastrointestinal: Negative for abdominal pain.  Musculoskeletal: Negative for neck pain.    Physical Exam Updated Vital Signs BP 129/85 (BP Location: Left Arm)   Pulse (!) 58   Temp 97.6 F (36.4 C) (Oral)   Resp 16   Ht 5\' 6"  (1.676 m)   Wt 68 kg   SpO2 100%   BMI 24.21 kg/m   Physical Exam Vitals and nursing note reviewed.  Constitutional:      General: He is not in acute distress. HENT:     Head: Normocephalic and atraumatic.     Nose: Nose normal.  Eyes:     General: No scleral icterus.    Comments: Extraocular movements are intact.  No scleral injection  or erythema.  Pupils are reactive to light and accommodation.  Extraocular movements are without any pain.  No discharge. Visual fields grossly intact in all 4 quadrants in each eye.  Patient is able to read exit site approximately 30 feet away.  Cardiovascular:     Rate and Rhythm: Normal rate and regular rhythm.     Pulses: Normal pulses.     Heart sounds: Normal heart sounds.  Pulmonary:     Effort: Pulmonary effort is normal. No respiratory distress.     Breath sounds: No wheezing.  Abdominal:     Palpations: Abdomen is soft.     Tenderness: There is no abdominal tenderness.  Musculoskeletal:     Cervical back: Normal range of motion.     Right lower leg: No edema.     Left lower leg: No edema.  Skin:    General: Skin is warm and dry.     Capillary Refill: Capillary refill  takes less than 2 seconds.     Comments: No track marks on her arm.  There is small indentation of arm which patient states is from IV when he was in the hospital.  The area does not appear infected.  Neurological:     Mental Status: He is alert. Mental status is at baseline.  Psychiatric:        Mood and Affect: Mood normal.        Behavior: Behavior normal.     ED Results / Procedures / Treatments   Labs (all labs ordered are listed, but only abnormal results are displayed) Labs Reviewed  RAPID URINE DRUG SCREEN, HOSP PERFORMED - Abnormal; Notable for the following components:      Result Value   Tetrahydrocannabinol POSITIVE (*)    All other components within normal limits  URINALYSIS, ROUTINE W REFLEX MICROSCOPIC - Abnormal; Notable for the following components:   Color, Urine COLORLESS (*)    Specific Gravity, Urine 1.002 (*)    All other components within normal limits  BASIC METABOLIC PANEL - Abnormal; Notable for the following components:   Potassium 3.2 (*)    All other components within normal limits  CBG MONITORING, ED - Abnormal; Notable for the following components:   Glucose-Capillary 112 (*)    All other components within normal limits  CBC WITH DIFFERENTIAL/PLATELET  CK    EKG None  Radiology CT Head Wo Contrast  Result Date: 03/18/2021 CLINICAL DATA:  Vision loss, dizziness EXAM: CT HEAD WITHOUT CONTRAST TECHNIQUE: Contiguous axial images were obtained from the base of the skull through the vertex without intravenous contrast. COMPARISON:  None. FINDINGS: Brain: Normal anatomic configuration. No abnormal intra or extra-axial mass lesion or fluid collection. No abnormal mass effect or midline shift. No evidence of acute intracranial hemorrhage or infarct. Ventricular size is normal. Cerebellum unremarkable. Vascular: Unremarkable Skull: Intact Sinuses/Orbits: Paranasal sinuses are clear. Orbits are unremarkable. Other: Mastoid air cells and middle ear cavities  are clear. IMPRESSION: No acute intracranial abnormality.  Normal examination. Electronically Signed   By: Helyn Numbers MD   On: 03/18/2021 22:42    Procedures Procedures   Medications Ordered in ED Medications - No data to display  ED Course  I have reviewed the triage vital signs and the nursing notes.  Pertinent labs & imaging results that were available during my care of the patient were reviewed by me and considered in my medical decision making (see chart for details).    MDM Rules/Calculators/A&P  Patient is 20 year old male presented today with very mild blurred vision for approximately a week or perhaps greater.  His vital signs are within normal limits on my evaluation his heart rate is approximately 70. Visual fields are intact Snellen chart reveals that he has no significant difference between eyes in terms of visual acuity. PERRLA, EOMI  Patient has UDS positive for marijuana but no other abnormalities.  CK within normal limits, urinalysis unremarkable, BMP with very mild hypokalemia, CBC without leukocytosis or anemia.  CT head unremarkable.  Discussed case with my attending physician.  Will discharge with PCP and ophthalmology and optometry  Low suspicion for septic emboli - - pt denies IV drug use. Doubt central process such as tumor. CT negative. FU with ophth.   Final Clinical Impression(s) / ED Diagnoses Final diagnoses:  Blurred vision    Rx / DC Orders ED Discharge Orders    None       Gailen Shelter, [place] 03/20/21 0105    Charlynne Pander, MD 03/20/21 [phone]

## 2021-03-18 NOTE — Discharge Instructions (Signed)
Your workup today was reassuringly normal.  Please follow-up with an ophthalmologist I also recommend following up with an optometrist.  I have given you the information for the former.  Please call to make an appointment with them.

## 2021-03-18 NOTE — ED Notes (Signed)
Right 10/63 Left 10/40

## 2021-03-18 NOTE — ED Notes (Signed)
Patient back from CT.

## 2021-04-15 ENCOUNTER — Emergency Department
Admission: EM | Admit: 2021-04-15 | Discharge: 2021-04-15 | Disposition: A | Discharge status: Home / Self Care | Payer: Medicare Other | Attending: Emergency Medicine | Admitting: Emergency Medicine

## 2021-04-15 DIAGNOSIS — Z20822 Contact with and (suspected) exposure to covid-19: Secondary | ICD-10-CM | POA: Diagnosis not present

## 2021-04-15 DIAGNOSIS — F29 Unspecified psychosis not due to a substance or known physiological condition: Secondary | ICD-10-CM | POA: Insufficient documentation

## 2021-04-15 DIAGNOSIS — T401X1A Poisoning by heroin, accidental (unintentional), initial encounter: Secondary | ICD-10-CM | POA: Diagnosis present

## 2021-04-15 DIAGNOSIS — R451 Restlessness and agitation: Secondary | ICD-10-CM | POA: Diagnosis not present

## 2021-04-15 DIAGNOSIS — F1910 Other psychoactive substance abuse, uncomplicated: Secondary | ICD-10-CM

## 2021-04-15 DIAGNOSIS — Z87891 Personal history of nicotine dependence: Secondary | ICD-10-CM | POA: Diagnosis not present

## 2021-04-15 DIAGNOSIS — T50901A Poisoning by unspecified drugs, medicaments and biological substances, accidental (unintentional), initial encounter: Secondary | ICD-10-CM | POA: Diagnosis not present

## 2021-04-15 LAB — ETHANOL: Alcohol, Ethyl (B): <10 mg/dL (ref <10)

## 2021-04-15 LAB — CBC
HCT: 42 % (ref 39.0–52.0)
Hemoglobin: 13.9 g/dL (ref 13.0–17.0)
MCH: 30.2 pg (ref 26.0–34.0)
MCHC: 33.1 g/dL (ref 30.0–36.0)
MCV: 91.1 fL (ref 80.0–100.0)
Platelets: 481 10*3/uL — ABNORMAL HIGH (ref 150–400)
RBC: 4.61 MIL/uL (ref 4.22–5.81)
RDW: 12.7 % (ref 11.5–15.5)
WBC: 17.6 10*3/uL — ABNORMAL HIGH (ref 4.0–10.5)
nRBC: 0 % (ref 0.0–0.2)

## 2021-04-15 LAB — COMPREHENSIVE METABOLIC PANEL
ALT: 28 U/L (ref 0–44)
AST: 42 U/L — ABNORMAL HIGH (ref 15–41)
Albumin: 4.9 g/dL (ref 3.5–5.0)
Alkaline Phosphatase: 103 U/L (ref 38–126)
Anion gap: 13 (ref 5–15)
BUN: 8 mg/dL (ref 6–20)
CO2: 23 mmol/L (ref 22–32)
Calcium: 9.5 mg/dL (ref 8.9–10.3)
Chloride: 108 mmol/L (ref 98–111)
Creatinine, Ser: 1.59 mg/dL — ABNORMAL HIGH (ref 0.61–1.24)
GFR, Estimated: >60 mL/min (ref >60)
Glucose, Bld: 138 mg/dL — ABNORMAL HIGH (ref 70–99)
Potassium: 4.2 mmol/L (ref 3.5–5.1)
Sodium: 144 mmol/L (ref 135–145)
Total Bilirubin: 1 mg/dL (ref 0.3–1.2)
Total Protein: 8 g/dL (ref 6.5–8.1)

## 2021-04-15 LAB — SALICYLATE LEVEL: Salicylate Lvl: <7 mg/dL — ABNORMAL LOW (ref 7.0–30.0)

## 2021-04-15 LAB — RESP PANEL BY RT-PCR (FLU A&B, COVID) ARPGX2
Influenza A by PCR: NEGATIVE
Influenza B by PCR: NEGATIVE
SARS Coronavirus 2 by RT PCR: NEGATIVE

## 2021-04-15 LAB — URINE DRUG SCREEN, QUALITATIVE (ARMC ONLY)
Amphetamines, Ur Screen: NOT DETECTED
Barbiturates, Ur Screen: NOT DETECTED
Benzodiazepine, Ur Scrn: POSITIVE — AB
Cannabinoid 50 Ng, Ur ~~LOC~~: POSITIVE — AB
Cocaine Metabolite,Ur ~~LOC~~: NOT DETECTED
MDMA (Ecstasy)Ur Screen: NOT DETECTED
Methadone Scn, Ur: NOT DETECTED
Opiate, Ur Screen: NOT DETECTED
Phencyclidine (PCP) Ur S: NOT DETECTED
Tricyclic, Ur Screen: NOT DETECTED

## 2021-04-15 LAB — ACETAMINOPHEN LEVEL: Acetaminophen (Tylenol), Serum: <10 ug/mL — ABNORMAL LOW (ref 10–30)

## 2021-04-15 MED ORDER — SODIUM CHLORIDE 0.9 % IV BOLUS
1000.0000 mL | Freq: Once | INTRAVENOUS | Status: AC
  Administered 2021-04-15: 1000 mL via INTRAVENOUS

## 2021-04-15 MED ORDER — LORAZEPAM 2 MG/ML IJ SOLN
INTRAMUSCULAR | Status: AC
  Administered 2021-04-15: 2 mg via INTRAVENOUS
  Filled 2021-04-15: qty 1

## 2021-04-15 MED ORDER — HALOPERIDOL LACTATE 5 MG/ML IJ SOLN
5.0000 mg | Freq: Once | INTRAMUSCULAR | Status: AC
  Administered 2021-04-15: 5 mg via INTRAVENOUS

## 2021-04-15 MED ORDER — LORAZEPAM 2 MG/ML IJ SOLN: 2.0000 mg | Freq: Once | INTRAMUSCULAR | Status: AC

## 2021-04-15 NOTE — BH Assessment (Signed)
TTS and Psych provider attempted to speak with patient. Patient would wake up briefly and fall back to sleep. Patient provided limited information and did not know why he was brought to the ED.

## 2021-04-15 NOTE — ED Notes (Signed)
Resumed care from Culley Hedeen b rn.  Pt sleepy, easily aroused.  Sinus on monitor.  Pt calm and cooperative.

## 2021-04-15 NOTE — ED Notes (Signed)
Pt calm and cooperative.  D/c inst to pt.

## 2021-04-15 NOTE — Discharge Instructions (Addendum)
You have been seen in the emergency department for a  psychiatric concern. You have been evaluated both medically as well as psychiatrically. Please follow-up with your outpatient resources provided. Return to the emergency department for any worsening symptoms, or any thoughts of hurting yourself or anyone else so that we may attempt to help you. 

## 2021-04-15 NOTE — Consult Note (Signed)
[facility] Face-to-Face Psychiatry Consult   Reason for Consult: Consult for this 20 year old brought in with altered mental status and reports of overdose Referring Physician: Paduchowski Patient Identification: Zachary Soto MRN:  [id number] Principal Diagnosis: Overdose Diagnosis:  Principal Problem:   Overdose   Total Time spent with patient: 45 minutes  Subjective:   Zachary Soto is a 20 y.o. male patient admitted with "I think they said I overdosed".  HPI: Patient seen and chart reviewed.  Patient is sedated right now and able to provide only limited information.  He is arousable but falls back to sleep easily.  He tells me he has been told that he overdosed.  He was not able to stay awake enough to give a history or say what he had taken.  Right now he seems medically fairly stable with improved oxygen saturation.  Drug screen was obtained and is positive only for cannabis and benzodiazepines.  No alcohol on board.  Past Psychiatric History: Patient has had several prior hospitalizations for opiate overdose.  "Percocet" has been identified as the drug of choice and most of the previous notes and was mentioned in the admitting notes this time as well.  Unclear if he has had any kind of mental health or substance abuse treatment.  Risk to Self:   Risk to Others:   Prior Inpatient Therapy:   Prior Outpatient Therapy:    Past Medical History:  Past Medical History:  Diagnosis Date  . Back pain   . Scoliosis     Past Surgical History:  Procedure Laterality Date  . CLEFT PALATE REPAIR    . TONSILLECTOMY     Family History:  Family History  Problem Relation Age of Onset  . Hypertension Mother   . Hypertension Father    Family Psychiatric  History: None known Social History:  Social History   Substance and Sexual Activity  Alcohol Use Yes   Comment: "blunts this morning"     Social History   Substance and Sexual Activity  Drug Use Yes  .  Types: Marijuana, Oxycodone    Social History   Socioeconomic History  . Marital status: Single    Spouse name: Not on file  . Number of children: Not on file  . Years of education: Not on file  . Highest education level: Not on file  Occupational History  . Not on file  Tobacco Use  . Smoking status: Former Games developer  . Smokeless tobacco: Never Used  Vaping Use  . Vaping Use: Never used  Substance and Sexual Activity  . Alcohol use: Yes    Comment: "blunts this morning"  . Drug use: Yes    Types: Marijuana, Oxycodone  . Sexual activity: Not on file  Other Topics Concern  . Not on file  Social History Narrative  . Not on file   Social Determinants of Health   Financial Resource Strain: Not on file  Food Insecurity: Not on file  Transportation Needs: Not on file  Physical Activity: Not on file  Stress: Not on file  Social Connections: Not on file   Additional Social History:    Allergies:  No Known Allergies  Labs:  Results for orders placed or performed during the hospital encounter of 04/15/21 (from the past 48 hour(s))  CBC     Status: Abnormal   Collection Time: 04/15/21  3:10 PM  Result Value Ref Range   WBC 17.6 (H) 4.0 - 10.5 K/uL   RBC 4.61 4.22 -  5.81 MIL/uL   Hemoglobin 13.9 13.0 - 17.0 g/dL   HCT 90.3 00.9 - 23.3 %   MCV 91.1 80.0 - 100.0 fL   MCH 30.2 26.0 - 34.0 pg   MCHC 33.1 30.0 - 36.0 g/dL   RDW 00.7 62.2 - 63.3 %   Platelets 481 (H) 150 - 400 K/uL   nRBC 0.0 0.0 - 0.2 %    Comment: Performed at [facility], [address] [place], [place] [address]  Comprehensive metabolic panel     Status: Abnormal   Collection Time: 04/15/21  3:10 PM  Result Value Ref Range   Sodium 144 135 - 145 mmol/L   Potassium 4.2 3.5 - 5.1 mmol/L   Chloride 108 98 - 111 mmol/L   CO2 23 22 - 32 mmol/L   Glucose, Bld 138 (H) 70 - 99 mg/dL    Comment: Glucose reference range applies only to samples taken after fasting for at least 8 hours.   BUN 8 6 -  20 mg/dL   Creatinine, Ser 2.56 (H) 0.61 - 1.24 mg/dL   Calcium 9.5 8.9 - 38.9 mg/dL   Total Protein 8.0 6.5 - 8.1 g/dL   Albumin 4.9 3.5 - 5.0 g/dL   AST 42 (H) 15 - 41 U/L   ALT 28 0 - 44 U/L   Alkaline Phosphatase 103 38 - 126 U/L   Total Bilirubin 1.0 0.3 - 1.2 mg/dL   GFR, Estimated >37 >34 mL/min    Comment: (NOTE) Calculated using the CKD-EPI Creatinine Equation (2021)    Anion gap 13 5 - 15    Comment: Performed at [facility], [address] [place], [place] [address]  Acetaminophen level     Status: Abnormal   Collection Time: 04/15/21  3:10 PM  Result Value Ref Range   Acetaminophen (Tylenol), Serum <10 (L) 10 - 30 ug/mL    Comment: (NOTE) Therapeutic concentrations vary significantly. A range of 10-30 ug/mL  may be an effective concentration for many patients. However, some  are best treated at concentrations outside of this range. Acetaminophen concentrations >150 ug/mL at 4 hours after ingestion  and >50 ug/mL at 12 hours after ingestion are often associated with  toxic reactions.  Performed at [facility], [address] [place], [place] [address]   Salicylate level     Status: Abnormal   Collection Time: 04/15/21  3:10 PM  Result Value Ref Range   Salicylate Lvl <7.0 (L) 7.0 - 30.0 mg/dL    Comment: Performed at [facility], [address] [place], [place] [address]  Ethanol     Status: None   Collection Time: 04/15/21  3:10 PM  Result Value Ref Range   Alcohol, Ethyl (B) <10 <10 mg/dL    Comment: (NOTE) Lowest detectable limit for serum alcohol is 10 mg/dL.  For medical purposes only. Performed at [facility], [address] [place], [place] [address]   Resp Panel by RT-PCR (Flu A&B, Covid) Nasopharyngeal Swab     Status: None   Collection Time: 04/15/21  4:36 PM   Specimen: Nasopharyngeal Swab; Nasopharyngeal(NP) swabs in vial transport medium  Result Value Ref Range   SARS Coronavirus 2 by RT PCR  NEGATIVE NEGATIVE    Comment: (NOTE) SARS-CoV-2 target nucleic acids are NOT DETECTED.  The SARS-CoV-2 RNA is generally detectable in upper respiratory specimens during the acute phase of infection. The lowest concentration of SARS-CoV-2 viral copies this assay can detect is 138 copies/mL.  A negative result does not preclude SARS-Cov-2 infection and should not be used as the sole basis for treatment or other patient management decisions. A negative result may occur with  improper specimen collection/handling, submission of specimen other than nasopharyngeal swab, presence of viral mutation(s) within the areas targeted by this assay, and inadequate number of viral copies(<138 copies/mL). A negative result must be combined with clinical observations, patient history, and epidemiological information. The expected result is Negative.  Fact Sheet for Patients:  BloggerCourse.com  Fact Sheet for Healthcare Providers:  SeriousBroker.it  This test is no t yet approved or cleared by the [place] FDA and  has been authorized for detection and/or diagnosis of SARS-CoV-2 by FDA under an Emergency Use Authorization (EUA). This EUA will remain  in effect (meaning this test can be used) for the duration of the COVID-19 declaration under Section 564(b)(1) of the Act, 21 U.S.C.section 360bbb-3(b)(1), unless the authorization is terminated  or revoked sooner.       Influenza A by PCR NEGATIVE NEGATIVE   Influenza B by PCR NEGATIVE NEGATIVE    Comment: (NOTE) The Xpert Xpress SARS-CoV-2/FLU/RSV plus assay is intended as an aid in the diagnosis of influenza from Nasopharyngeal swab specimens and should not be used as a sole basis for treatment. Nasal washings and aspirates are unacceptable for Xpert Xpress SARS-CoV-2/FLU/RSV testing.  Fact Sheet for Patients: BloggerCourse.com  Fact Sheet for Healthcare  Providers: SeriousBroker.it  This test is not yet approved or cleared by the [place] FDA and has been authorized for detection and/or diagnosis of SARS-CoV-2 by FDA under an Emergency Use Authorization (EUA). This EUA will remain in effect (meaning this test can be used) for the duration of the COVID-19 declaration under Section 564(b)(1) of the Act, 21 U.S.C. section 360bbb-3(b)(1), unless the authorization is terminated or revoked.  Performed at [facility], [address], [place], [place] [address]   Urine Drug Screen, Qualitative [facility] only)     Status: Abnormal   Collection Time: 04/15/21  4:52 PM  Result Value Ref Range   Tricyclic, Ur Screen NONE DETECTED NONE DETECTED   Amphetamines, Ur Screen NONE DETECTED NONE DETECTED   MDMA (Ecstasy)Ur Screen NONE DETECTED NONE DETECTED   Cocaine Metabolite,Ur [place] NONE DETECTED NONE DETECTED   Opiate, Ur Screen NONE DETECTED NONE DETECTED   Phencyclidine (PCP) Ur S NONE DETECTED NONE DETECTED   Cannabinoid 50 Ng, Ur [place] POSITIVE (A) NONE DETECTED   Barbiturates, Ur Screen NONE DETECTED NONE DETECTED   Benzodiazepine, Ur Scrn POSITIVE (A) NONE DETECTED   Methadone Scn, Ur NONE DETECTED NONE DETECTED    Comment: (NOTE) Tricyclics + metabolites, urine    Cutoff 1000 ng/mL Amphetamines + metabolites, urine  Cutoff 1000 ng/mL MDMA (Ecstasy), urine              Cutoff 500 ng/mL Cocaine Metabolite, urine          Cutoff 300 ng/mL Opiate + metabolites, urine        Cutoff 300 ng/mL Phencyclidine (PCP), urine         Cutoff 25 ng/mL Cannabinoid, urine                 Cutoff 50 ng/mL Barbiturates + metabolites, urine  Cutoff 200 ng/mL Benzodiazepine, urine              Cutoff 200 ng/mL Methadone, urine                   Cutoff  300 ng/mL  The urine drug screen provides only a preliminary, unconfirmed analytical test result and should not be used for non-medical purposes. Clinical consideration and  professional judgment should be applied to any positive drug screen result due to possible interfering substances. A more specific alternate chemical method must be used in order to obtain a confirmed analytical result. Gas chromatography / mass spectrometry (GC/MS) is the preferred confirm atory method. Performed at [facility], [address] [place], [place] [address]     Current Facility-Administered Medications  Medication Dose Route Frequency Provider Last Rate Last Admin  . sodium chloride 0.9 % bolus 1,000 mL  1,000 mL Intravenous Once Minna Antis, MD       No current outpatient medications on file.    Musculoskeletal: Strength & Muscle Tone: within normal limits Gait & Station: normal, unable to stand Patient leans: N/A            Psychiatric Specialty Exam:  Presentation  General Appearance: No data recorded Eye Contact:No data recorded Speech:No data recorded Speech Volume:No data recorded Handedness:No data recorded  Mood and Affect  Mood:No data recorded Affect:No data recorded  Thought Process  Thought Processes:No data recorded Descriptions of Associations:No data recorded Orientation:No data recorded Thought Content:No data recorded History of Schizophrenia/Schizoaffective disorder:No data recorded Duration of Psychotic Symptoms:No data recorded Hallucinations:No data recorded Ideas of Reference:No data recorded Suicidal Thoughts:No data recorded Homicidal Thoughts:No data recorded  Sensorium  Memory:No data recorded Judgment:No data recorded Insight:No data recorded  Executive Functions  Concentration:No data recorded Attention Span:No data recorded Recall:No data recorded Fund of Knowledge:No data recorded Language:No data recorded  Psychomotor Activity  Psychomotor Activity:No data recorded  Assets  Assets:No data recorded  Sleep  Sleep:No data recorded  Physical Exam: Physical Exam Vitals and  nursing note reviewed.  Constitutional:      Appearance: Normal appearance.  HENT:     Head: Normocephalic and atraumatic.     Mouth/Throat:     Pharynx: Oropharynx is clear.  Eyes:     Pupils: Pupils are equal, round, and reactive to light.  Cardiovascular:     Rate and Rhythm: Normal rate and regular rhythm.  Pulmonary:     Effort: Pulmonary effort is normal.     Breath sounds: Normal breath sounds.  Abdominal:     General: Abdomen is flat.     Palpations: Abdomen is soft.  Musculoskeletal:        General: Normal range of motion.  Skin:    General: Skin is warm and dry.  Neurological:     General: No focal deficit present.     Mental Status: He is alert. Mental status is at baseline.  Psychiatric:        Attention and Perception: He is inattentive.        Mood and Affect: Mood normal. Affect is blunt.        Speech: Speech is delayed.        Behavior: Behavior is slowed.        Thought Content: Thought content normal. Thought content does not include suicidal ideation.        Cognition and Memory: Cognition is impaired. Memory is impaired.        Judgment: Judgment is impulsive.    Review of Systems  Unable to perform ROS: Mental status change   Blood pressure (!) 161/104, pulse (!) 115, temperature 100 F (37.8 C), temperature source Axillary, resp. rate 17, SpO2 97 %. There is no height  or weight on file to calculate BMI.  Treatment Plan Summary: Plan Currently patient is sedated and could not be awoken enough to provide useful information.  Most likely an accidental overdose of opiates.  No orders written.  We will try to follow up as needed.  Percocet is oxycodone which typically is not shown on the urine drug screen we use here so that would still be consistent with the history and the labs we have.  Disposition: Supportive therapy provided about ongoing stressors.  Mordecai RasmussenJohn Sayid Moll, MD 04/15/2021 6:24 PM

## 2021-04-15 NOTE — BH Assessment (Signed)
Comprehensive Clinical Assessment (CCA) Note  04/15/2021 Zachary Soto [id number] Recommendations for Services/Supports/Treatments: Consulted with Madaline Brilliant, NP who determined that pt. is psych cleared with a recommendation to follow up with substance abuse treatment. Notified Dr.Paduchowski and Amy, RN of disposition recommendation.  Pt was sitting upright in bed upon this pt's arrival, appearing visibly in distress. Pt's speech was clear and coherent. Thought processes were linear and relevant to the context. Motor behavior was unremarkable. Pt was not responding to internal or external stimuli. Eye contact was good. Pt's mood is anxious; affect is responsive. Pt denied using any drugs and explained that he was confused about why he'd been brought to the hospital. Pt stated, "They worry about me because I was on drugs back then. But not now." Pt admitted to being depressed at times. The pt. identified his main issues as having a rough upbringing. Pt explained that he grew up in a family of immigrants from [place] in a two-bedroom trailer with a family size of 10/11. Pt reported that he is on probation and has a court date on May 25th for underage drinking. Pt reported that he works in [employer] and has a 39-year-old daughter, therefore he wanted to discharge. Pt had limited insight. While pt was able to admit his need to get help for his substance abuse, he was in denial about the severity of his substance abuse. Pt explained that he prefers to get help on an outpatient basis. The patient denied current SI, HI or AV/H.     Flowsheet Row ED from 04/15/2021 in [facility]PART[facility] ED from 03/18/2021 in [facility] EMERGENCY DEPARTMENT ED to Hosp-Admission (Discharged) from 03/13/2021 in [facility] GENERAL SURGERY  C-SSRS RISK CATEGORY No Risk Error: Question 6 not populated No Risk    The patient demonstrates the following  risk factors for suicide: Chronic risk factors for suicide include: substance use disorder. Acute risk factors for suicide include: family or marital conflict. Protective factors for this patient include: hope for the future. Considering these factors, the overall suicide risk at this point appears to be no risk. Patient is appropriate for outpatient follow up.  Chief Complaint:  Chief Complaint  Patient presents with  . Altered Mental Status   Visit Diagnosis: Accidental overdose    CCA Screening, Triage and Referral (STR)  Patient Reported Information How did you hear about [place]? Self  Referral name: Self  Referral phone number: No data recorded  Whom do you see for routine medical problems? I don't have a doctor  Practice/Facility Name: No data recorded Practice/Facility Phone Number: No data recorded Name of Contact: No data recorded Contact Number: No data recorded Contact Fax Number: No data recorded Prescriber Name: No data recorded Prescriber Address (if known): No data recorded  What Is the Reason for Your Visit/Call Today? "I didn't take any drugs. I am confused."  How Long Has This Been Causing You Problems? <Week  What Do You Feel Would Help You the Most Today? Alcohol or Drug Use Treatment   Have You Recently Been in Any Inpatient Treatment (Hospital/Detox/Crisis Center/28-Day Program)? No  Name/Location of Program/Hospital:No data recorded How Long Were You There? No data recorded When Were You Discharged? No data recorded  Have You Ever Received Services From [facility] Before? No  Who Do You See at [facility]? No data recorded  Have You Recently Had Any Thoughts About Hurting Yourself? No  Are You Planning to Commit  Suicide/Harm Yourself At This time? No   Have you Recently Had Thoughts About Hurting Someone Karolee Ohs? No  Explanation: No data recorded  Have You Used Any Alcohol or Drugs in the Past 24 Hours? No  How Long Ago Did You Use Drugs or  Alcohol? No data recorded What Did You Use and How Much? No data recorded  Do You Currently Have a Therapist/Psychiatrist? No  Name of Therapist/Psychiatrist: No data recorded  Have You Been Recently Discharged From Any Office Practice or Programs? No  Explanation of Discharge From Practice/Program: No data recorded    CCA Screening Triage Referral Assessment Type of Contact: Face-to-Face  Is this Initial or Reassessment? No data recorded Date Telepsych consult ordered in [facility]:  No data recorded Time Telepsych consult ordered in [facility]:  No data recorded  Patient Reported Information Reviewed? Yes  Patient Left Without Being Seen? No data recorded Reason for Not Completing Assessment: No data recorded  Collateral Involvement: Selena ([phone])   Does Patient Have a Court Appointed Legal Guardian? No data recorded Name and Contact of Legal Guardian: No data recorded If Minor and Not Living with Parent(s), Who has Custody? n/a  Is CPS involved or ever been involved? Never  Is APS involved or ever been involved? Never   Patient Determined To Be At Risk for Harm To Self or Others Based on Review of Patient Reported Information or Presenting Complaint? No  Method: No data recorded Availability of Means: No data recorded Intent: No data recorded Notification Required: No data recorded Additional Information for Danger to Others Potential: No data recorded Additional Comments for Danger to Others Potential: No data recorded Are There Guns or Other Weapons in Your Home? No data recorded Types of Guns/Weapons: No data recorded Are These Weapons Safely Secured?                            No data recorded Who Could Verify You Are Able To Have These Secured: No data recorded Do You Have any Outstanding Charges, Pending Court Dates, Parole/Probation? No data recorded Contacted To Inform of Risk of Harm To Self or Others: No data recorded  Location of Assessment: [facility]  ED   Does Patient Present under Involuntary Commitment? No  IVC Papers Initial File Date: No data recorded  [place] of Residence: [place]   Patient Currently Receiving the Following Services: Not Receiving Services   Determination of Need: Urgent (48 hours)   Options For Referral: -- (Per Annice Pih T., pt is psych cleared and can discharge.)     CCA Biopsychosocial Intake/Chief Complaint:  No data recorded Current Symptoms/Problems: No data recorded  Patient Reported Schizophrenia/Schizoaffective Diagnosis in Past: No data recorded  Strengths: No data recorded Preferences: No data recorded Abilities: No data recorded  Type of Services Patient Feels are Needed: No data recorded  Initial Clinical Notes/Concerns: No data recorded  Mental Health Symptoms Depression:  No data recorded  Duration of Depressive symptoms: No data recorded  Mania:  No data recorded  Anxiety:   No data recorded  Psychosis:  No data recorded  Duration of Psychotic symptoms: No data recorded  Trauma:  No data recorded  Obsessions:  No data recorded  Compulsions:  No data recorded  Inattention:  No data recorded  Hyperactivity/Impulsivity:  No data recorded  Oppositional/Defiant Behaviors:  No data recorded  Emotional Irregularity:  No data recorded  Other Mood/Personality Symptoms:  No data recorded   Mental Status Exam  Appearance and self-care  Stature:  No data recorded  Weight:  No data recorded  Clothing:  No data recorded  Grooming:  No data recorded  Cosmetic use:  No data recorded  Posture/gait:  No data recorded  Motor activity:  No data recorded  Sensorium  Attention:  No data recorded  Concentration:  No data recorded  Orientation:  No data recorded  Recall/memory:  No data recorded  Affect and Mood  Affect:  No data recorded  Mood:  No data recorded  Relating  Eye contact:  No data recorded  Facial expression:  No data recorded  Attitude toward examiner:  No data  recorded  Thought and Language  Speech flow: No data recorded  Thought content:  No data recorded  Preoccupation:  No data recorded  Hallucinations:  No data recorded  Organization:  No data recorded  [employer] of Knowledge:  No data recorded  Intelligence:  No data recorded  Abstraction:  No data recorded  Judgement:  No data recorded  Reality Testing:  No data recorded  Insight:  No data recorded  Decision Making:  No data recorded  Social Functioning  Social Maturity:  No data recorded  Social Judgement:  No data recorded  Stress  Stressors:  No data recorded  Coping Ability:  No data recorded  Skill Deficits:  No data recorded  Supports:  No data recorded    Religion:    Leisure/Recreation:    Exercise/Diet:     CCA Employment/Education Employment/Work Situation:    Education:     CCA Family/Childhood History Family and Relationship History:    Childhood History:     Child/Adolescent Assessment:     CCA Substance Use Alcohol/Drug Use:                           ASAM's:  Six Dimensions of Multidimensional Assessment  Dimension 1:  Acute Intoxication and/or Withdrawal Potential:      Dimension 2:  Biomedical Conditions and Complications:      Dimension 3:  Emotional, Behavioral, or Cognitive Conditions and Complications:     Dimension 4:  Readiness to Change:     Dimension 5:  Relapse, Continued use, or Continued Problem Potential:     Dimension 6:  Recovery/Living Environment:     ASAM Severity Score:    ASAM Recommended Level of Treatment:     Substance use Disorder (SUD)    Recommendations for Services/Supports/Treatments:    DSM5 Diagnoses: Patient Active Problem List   Diagnosis Date Noted  . Non-traumatic rhabdomyolysis 03/13/2021  . Drug overdose 01/16/2021  . Overdose 07/01/2016   Porcia Morganti R Kashayla Ungerer, LCAS

## 2021-04-15 NOTE — ED Notes (Signed)
IVC, awaiting custody order to be signed by [employer]
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
## 2021-04-15 NOTE — ED Notes (Signed)
RN able to briefly speak with pt.  Pt denies SI/HI. Unable to complete full assessment at this time. (Pt falling asleep).

## 2021-04-15 NOTE — ED Notes (Addendum)
Pt cooperative
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
## 2021-04-15 NOTE — ED Notes (Signed)
Investment banker, corporate to correct city [place] to [place].

## 2021-04-15 NOTE — ED Notes (Signed)
Pt awakened briefly to have some sips of water, but immediately falls back asleep.
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
## 2021-04-15 NOTE — ED Notes (Signed)
IVC rescinded by Dr. Lenard Lance

## 2021-04-15 NOTE — ED Notes (Signed)
Psych in with pt now.  Pt calm and cooperative   Security with pt

## 2021-04-15 NOTE — ED Notes (Signed)
md in with pt.  Pt pulled monitor leads off, iv out and walked out of room wanting to leave.  Security with pt.

## 2021-05-25 IMAGING — DX DG CHEST 1V PORT
1 series · 1 of 1 positions shown · non-contrast
Comparison: January 16, 2021

CLINICAL DATA: Altered mental status and diaphoresis.

EXAM:
PORTABLE CHEST 1 VIEW

[chest ap]
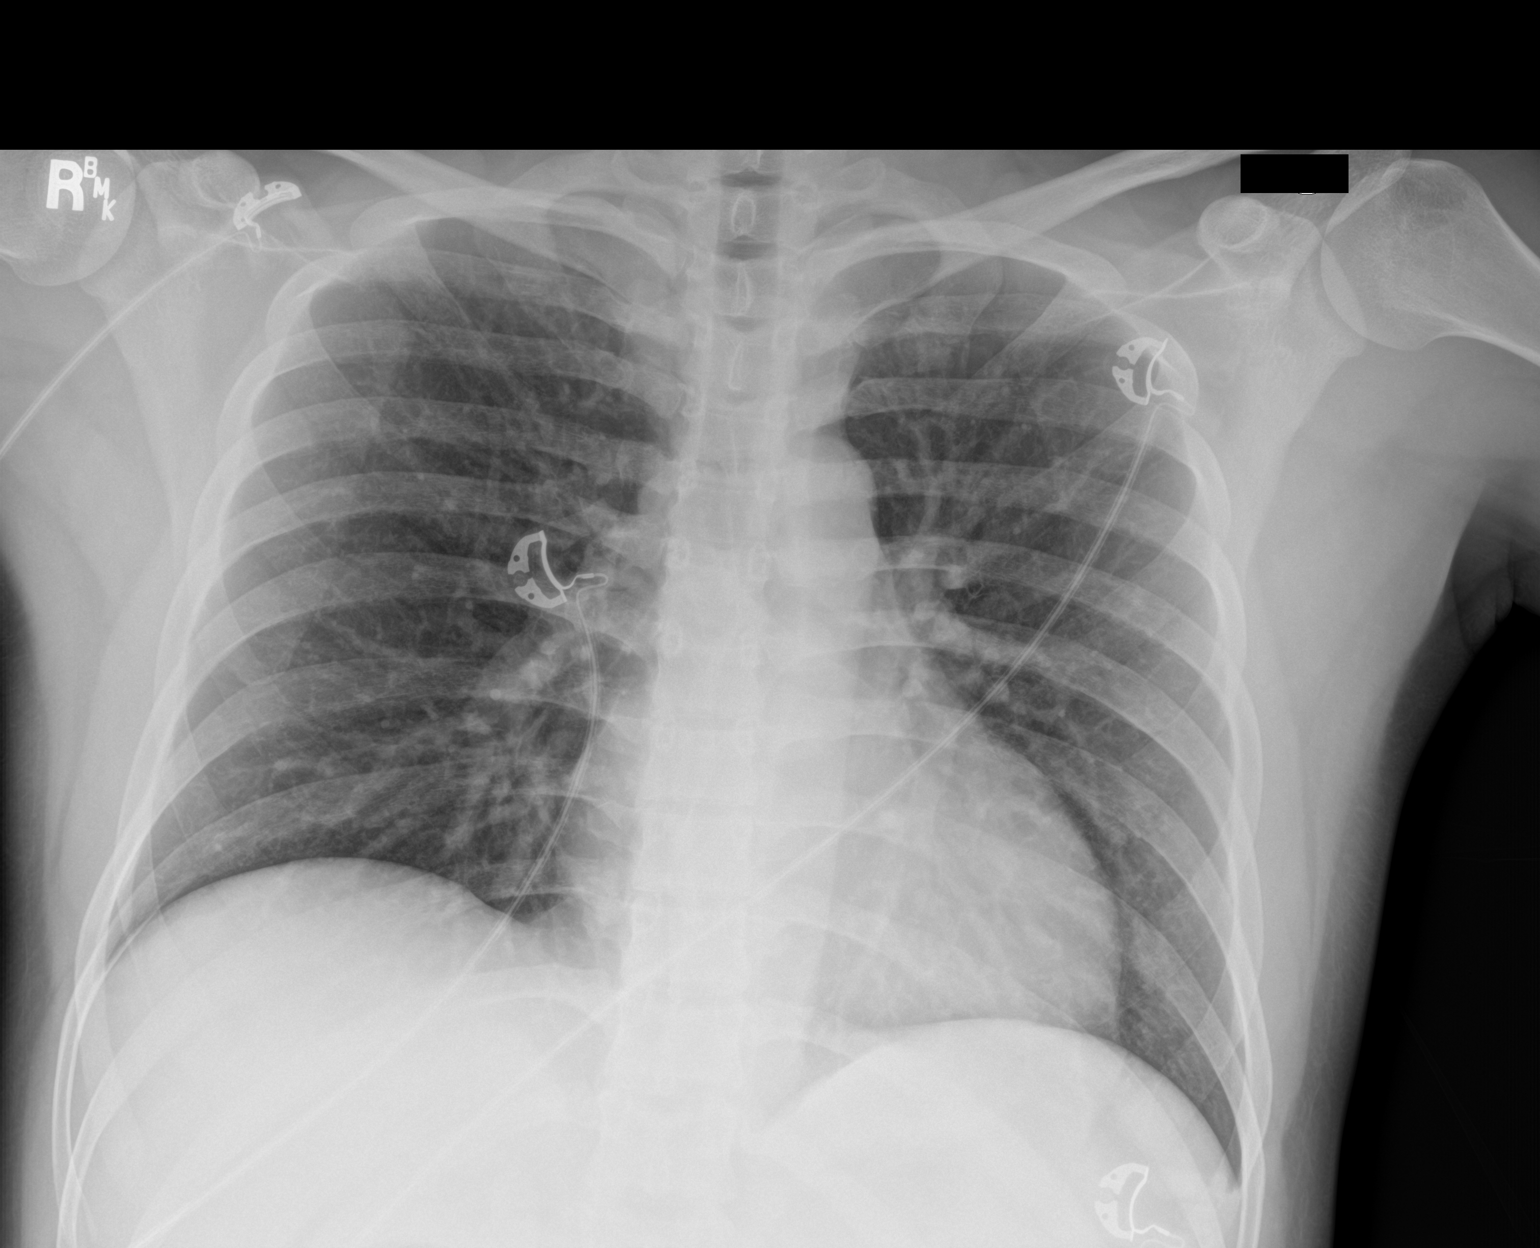

[1 of 1 positions shown; findings below may reference images not displayed]

FINDINGS: The heart size and mediastinal contours are within normal limits.
Both lungs are clear. The visualized skeletal structures are
unremarkable.
IMPRESSION: No active disease.

## 2021-12-17 ENCOUNTER — Other Ambulatory Visit: Payer: Self-pay

## 2021-12-17 ENCOUNTER — Emergency Department (HOSPITAL_COMMUNITY)
Admission: EM | Admit: 2021-12-17 | Discharge: 2021-12-17 | Disposition: A | Discharge status: Home / Self Care | Payer: Medicare Other | Attending: Emergency Medicine | Admitting: Emergency Medicine

## 2021-12-17 DIAGNOSIS — B9789 Other viral agents as the cause of diseases classified elsewhere: Secondary | ICD-10-CM | POA: Diagnosis not present

## 2021-12-17 DIAGNOSIS — J028 Acute pharyngitis due to other specified organisms: Secondary | ICD-10-CM | POA: Diagnosis not present

## 2021-12-17 DIAGNOSIS — R059 Cough, unspecified: Secondary | ICD-10-CM | POA: Diagnosis present

## 2021-12-17 DIAGNOSIS — Z20822 Contact with and (suspected) exposure to covid-19: Secondary | ICD-10-CM | POA: Diagnosis not present

## 2021-12-17 DIAGNOSIS — H6693 Otitis media, unspecified, bilateral: Secondary | ICD-10-CM | POA: Diagnosis not present

## 2021-12-17 DIAGNOSIS — J029 Acute pharyngitis, unspecified: Secondary | ICD-10-CM

## 2021-12-17 DIAGNOSIS — H6690 Otitis media, unspecified, unspecified ear: Secondary | ICD-10-CM

## 2021-12-17 LAB — RESP PANEL BY RT-PCR (FLU A&B, COVID) ARPGX2
Influenza A by PCR: NEGATIVE
Influenza B by PCR: NEGATIVE
SARS Coronavirus 2 by RT PCR: NEGATIVE

## 2021-12-17 LAB — GROUP A STREP BY PCR: Group A Strep by PCR: NOT DETECTED

## 2021-12-17 MED ORDER — CIPROFLOXACIN-DEXAMETHASONE 0.3-0.1 % OT SUSP
4.0000 [drp] | Freq: Once | OTIC | Status: AC
  Administered 2021-12-17: 4 [drp] via OTIC
  Filled 2021-12-17: qty 7.5

## 2021-12-17 MED ORDER — LIDOCAINE VISCOUS HCL 2 % MT SOLN
15.0000 mL | Freq: Once | OROMUCOSAL | Status: AC
  Administered 2021-12-17: 15 mL via OROMUCOSAL
  Filled 2021-12-17: qty 15

## 2021-12-17 MED ORDER — CIPROFLOXACIN-DEXAMETHASONE 0.3-0.1 % OT SUSP: 4.0000 [drp] | Freq: Two times a day (BID) | OTIC | 0 refills | Status: AC

## 2021-12-17 MED ORDER — AMOXICILLIN 500 MG PO CAPS: 500.0000 mg | ORAL_CAPSULE | Freq: Two times a day (BID) | ORAL | 0 refills | Status: AC

## 2021-12-17 MED ORDER — AMOXICILLIN 500 MG PO CAPS
500.0000 mg | ORAL_CAPSULE | Freq: Once | ORAL | Status: AC
  Administered 2021-12-17: 500 mg via ORAL
  Filled 2021-12-17: qty 1

## 2021-12-17 MED ORDER — IBUPROFEN 800 MG PO TABS
800.0000 mg | ORAL_TABLET | Freq: Once | ORAL | Status: AC
  Administered 2021-12-17: 800 mg via ORAL
  Filled 2021-12-17: qty 1

## 2021-12-17 NOTE — ED Notes (Signed)
Pt verbalizes understanding of discharge instructions. Opportunity for questions and answers were provided. Pt discharged from the ED.   ?

## 2021-12-17 NOTE — ED Provider Triage Note (Signed)
Emergency Medicine Provider Triage Evaluation Note  Josealberto Montalto Claudette Head , a 21 y.o. male  was evaluated in triage.  Pt complains of sore throat for the past few days which is worsening.  Has taken over-the-counter medication without improvement, worsening with swallowing.  Review of Systems  Positive: Sore throat, eye drainage Negative: ever  Physical Exam  BP (!) 142/104    Pulse 91    Temp 98 F (36.7 C) (Oral)    Resp 16    SpO2 99%  Gen:   Awake, no distress   Resp:  Normal effort  MSK:   Moves extremities without difficulty  Other:    Medical Decision Making  Medically screening exam initiated at 11:46 AM.  Appropriate orders placed.  [name] Claudette Head was informed that the remainder of the evaluation will be completed by another provider, this initial triage assessment does not replace that evaluation, and the importance of remaining in the ED until their evaluation is complete.  Patient with URI appearance.  Oropharynx is clear without any swelling.  Prior tonsillectomy and will of his uvula.  Speaking in full sentences without hypoxia and afebrile.  COVID test sent.   Claude Manges, PA-C 12/17/21 1147

## 2021-12-17 NOTE — Discharge Instructions (Addendum)
Take the medications as prescribed  Return for new or worsening symptoms 

## 2021-12-17 NOTE — ED Provider Notes (Signed)
[facility]   CSN: [id number] Arrival date & time: 12/17/21  1130    History  Chief Complaint  Patient presents with   Sore Throat    Zachary LenzKevin Julio Claudette Headrellano Soto is a 21 y.o. male with medical history significant for cleft repair here for evaluation of sore throat.  Also has had a "mild" cough, rhinorrhea.  No recent sick contacts.  States his main complaint is sore throat.  Hurts when he swallows.  No unilateral neck pain.  He is post tonsillectomy, does not have uvula.  No changes in voice.  No pooling of secretions.  No dental pain, facial, neck swelling or neck stiffness.  Rates his pain a 7/10. Denies traumatic injuries, concern for intraoral STD  HPI     Home Medications Prior to Admission medications   Medication Sig Start Date End Date Taking? Authorizing Provider  amoxicillin (AMOXIL) 500 MG capsule Take 1 capsule (500 mg total) by mouth 2 (two) times daily for 7 days. 12/17/21 12/24/21 Yes Kayline Sheer A, PA-C  ciprofloxacin-dexamethasone (CIPRODEX) OTIC suspension Place 4 drops into the left ear 2 (two) times daily. 12/17/21  Yes Haydin Dunn A, PA-C      Allergies    Patient has no known allergies.    Review of Systems   Review of Systems  Constitutional:  Positive for appetite change. Negative for chills, fatigue and fever.  HENT:  Positive for congestion, postnasal drip, rhinorrhea and sore throat. Negative for ear discharge, ear pain, facial swelling, mouth sores, nosebleeds, sinus pressure, sneezing and voice change. Trouble swallowing: pain with swollowing.  Respiratory:  Positive for cough. Negative for apnea, choking, chest tightness, shortness of breath, wheezing and stridor.   Cardiovascular: Negative.   Gastrointestinal: Negative.   Genitourinary: Negative.   Musculoskeletal: Negative.   Skin: Negative.   Neurological: Negative.    Physical Exam Updated Vital Signs BP (!) 136/104    Pulse 77     Temp 98 F (36.7 C) (Oral)    Resp 20    SpO2 99%  Physical Exam Vitals and nursing note reviewed.  Constitutional:      General: He is not in acute distress.    Appearance: He is well-developed. He is not ill-appearing, toxic-appearing or diaphoretic.  HENT:     Head: Normocephalic and atraumatic.     Right Ear: Drainage and tenderness present. No swelling. A middle ear effusion is present. No mastoid tenderness. No hemotympanum. Tympanic membrane is erythematous. Tympanic membrane is not injected, scarred or perforated.     Left Ear: Tympanic membrane, ear canal and external ear normal.     Ears:     Comments: Non tender bl mastoid. Left ear with purulent drainage, mid ear effusion and erythematous TM    Nose: Rhinorrhea (clear) present.     Mouth/Throat:     Lips: Pink.     Mouth: Mucous membranes are moist. No injury, lacerations, oral lesions or angioedema.     Tongue: No lesions. Tongue does not deviate from midline.     Palate: No mass and lesions.     Pharynx: No pharyngeal swelling or oropharyngeal exudate.     Tonsils: No tonsillar exudate or tonsillar abscesses. 0 on the right. 0 on the left.     Comments: PO with mild erythema, No tonsils Bl. No uvula. No pooling of secretions, tongue midline. No angioedema, intraoral lesions. Cleft repair  Eyes:     Pupils: Pupils are equal, round, and  reactive to light.  Neck:     Comments: No neck rigidity, meningismus Cardiovascular:     Rate and Rhythm: Normal rate and regular rhythm.     Heart sounds: Normal heart sounds.  Pulmonary:     Effort: Pulmonary effort is normal. No respiratory distress.     Breath sounds: Normal breath sounds.     Comments: Clear bl, speaks in full sentences without difficulty Abdominal:     General: Bowel sounds are normal. There is no distension.     Palpations: Abdomen is soft.  Musculoskeletal:        General: Normal range of motion.     Cervical back: Normal range of motion and neck supple.   Skin:    General: Skin is warm and dry.     Capillary Refill: Capillary refill takes less than 2 seconds.  Neurological:     General: No focal deficit present.     Mental Status: He is alert and oriented to person, place, and time.    ED Results / Procedures / Treatments   Labs (all labs ordered are listed, but only abnormal results are displayed) Labs Reviewed  RESP PANEL BY RT-PCR (FLU A&B, COVID) [id number]  GROUP A STREP BY PCR    EKG None  Radiology No results found.  Procedures Procedures    Medications Ordered in ED Medications  amoxicillin (AMOXIL) capsule 500 mg (has no administration in time range)  ciprofloxacin-dexamethasone (CIPRODEX) 0.3-0.1 % OTIC (EAR) suspension 4 drop (has no administration in time range)  ibuprofen (ADVIL) tablet 800 mg (800 mg Oral Given 12/17/21 1622)  lidocaine (XYLOCAINE) 2 % viscous mouth solution 15 mL (15 mLs Mouth/Throat Given 12/17/21 1623)    ED Course/ Medical Decision Making/ A&P   Pleasant 21 year old here for evaluation of sore throat over the last 4 days.  Painful to swallow however is drinking water in triage.  No lateral neck swelling.  No neck stiffness or neck rigidity.  He has no meningismus.  Associated "mild" nonproductive cough as well as some congestion and rhinorrhea.  No recent sick contacts.  No fever.  No change in voice, pooling of secretions.  No angioedema on exam.  He is post tonsillectomy, cleft palate repair.  He does not have a uvula.  He has no obvious edema to his posterior oropharynx.  There are some mild erythema however no lesions, exudate. No concern for intraoral STD.  Clinically patient has otitis of the left ear.  No neck stiffness, meningismus.  Nontender over bilateral mastoid.  Will treat with p.o. as well as drops given exam.  COVID, flu test personally viewed and interpreted which was ordered by triage, negative. Strep test neg  Low suspicion for PTA, RPA, epiglottitis, meningismus, Ludwig's  angina, deep space infection.  Suspect viral etiology of sore throat given associated congestion, cough.  He is tolerating p.o. intake here without difficulty.  No respiratory distress.  No pooling of secretions.  Will write for symptomatic management, have him follow-up with PCP.  He is agreeable.  The patient has been appropriately medically screened and/or stabilized in the ED. I have low suspicion for any other emergent medical condition which would require further screening, evaluation or treatment in the ED or require inpatient management.  Patient is hemodynamically stable and in no acute distress.  Patient able to ambulate in department prior to ED.  Evaluation does not show acute pathology that would require ongoing or additional emergent interventions while in the emergency department or further inpatient  treatment.  I have discussed the diagnosis with the patient and answered all questions.  Pain is been managed while in the emergency department and patient has no further complaints prior to discharge.  Patient is comfortable with plan discussed in room and is stable for discharge at this time.  I have discussed strict return precautions for returning to the emergency department.  Patient was encouraged to follow-up with PCP/specialist refer to at discharge.                           Medical Decision Making Amount and/or Complexity of Data Reviewed Independent Historian: parent Labs: ordered. Decision-making details documented in ED Course.           Final Clinical Impression(s) / ED Diagnoses Final diagnoses:  Acute otitis media, unspecified otitis media type  Viral pharyngitis    Rx / DC Orders ED Discharge Orders          Ordered    amoxicillin (AMOXIL) 500 MG capsule  2 times daily        12/17/21 1817    ciprofloxacin-dexamethasone (CIPRODEX) OTIC suspension  2 times daily        12/17/21 1817              Nilam Quakenbush A, PA-C 12/17/21 Andria Meuse, MD 12/17/21 2330

## 2021-12-17 NOTE — ED Triage Notes (Signed)
Pt reports sore and swollen throat x 1 week. Has been getting progressively worse and he reports he is now unable to swallow, although is drinkign water in triage. Has tried home remedies without relief.

## 2022-04-07 ENCOUNTER — Encounter: Payer: Self-pay | Admitting: Emergency Medicine

## 2022-04-07 ENCOUNTER — Emergency Department
Admission: EM | Admit: 2022-04-07 | Discharge: 2022-04-07 | Disposition: A | Discharge status: Home / Self Care | Payer: Medicare Other | Attending: Emergency Medicine | Admitting: Emergency Medicine

## 2022-04-07 ENCOUNTER — Other Ambulatory Visit: Payer: Self-pay

## 2022-04-07 DIAGNOSIS — Z760 Encounter for issue of repeat prescription: Secondary | ICD-10-CM

## 2022-04-07 DIAGNOSIS — G8918 Other acute postprocedural pain: Secondary | ICD-10-CM

## 2022-04-07 MED ORDER — OXYCODONE-ACETAMINOPHEN 5-325 MG PO TABS
2.0000 | ORAL_TABLET | Freq: Once | ORAL | Status: AC
  Administered 2022-04-07: 2 via ORAL
  Filled 2022-04-07: qty 2

## 2022-04-07 MED ORDER — OXYCODONE-ACETAMINOPHEN 5-325 MG PO TABS: 1.0000 | ORAL_TABLET | Freq: Four times a day (QID) | ORAL | 0 refills | Status: AC | PRN

## 2022-04-07 NOTE — ED Provider Notes (Signed)
? ?[facility] ?Provider Note ? ? ? Event Date/Time  ? First MD Initiated Contact with Patient 04/07/22 1414   ?  (approximate) ? ? ?History  ? ?Chief Complaint ?Medication Refill ? ? ?HPI ?Zachary Soto is a 21 y.o. male presents to the emergency department for evaluation of postoperative pain.  Patient states that he recently underwent cleft lip surgery and rhinoplasty approximately 4 days ago.  He states that they provide him with 10 tablets of 5 mg oxycodone, however he has gone through these and is still currently in severe pain.  His next appointment with his surgeon is May 11 and does not feel he can wait until then.  Denies fever/chills, chest pain, shortness of breath, abdominal pain, neck pain, dysphagia, nausea/vomiting, urinary symptoms, or headaches. ? ?History Limitations: Patient has difficulty speaking due to dressing covering his face and nasal packing. ? ?    ? ? ?Physical Exam  ?Triage Vital Signs: ?ED Triage Vitals [04/07/22 1410]  ?Enc Vitals Group  ?   BP (!) 134/92  ?   Pulse Rate 76  ?   Resp 20  ?   Temp 98.7 ?F (37.1 ?C)  ?   Temp Source Oral  ?   SpO2 97 %  ?   Weight 150 lb (68 kg)  ?   Height 5\' 6"  (1.676 m)  ?   Head Circumference   ?   Peak Flow   ?   Pain Score 10  ?   Pain Loc   ?   Pain Edu?   ?   Excl. in [place]?   ? ? ?Most recent vital signs: ?Vitals:  ? 04/07/22 1410  ?BP: (!) 134/92  ?Pulse: 76  ?Resp: 20  ?Temp: 98.7 ?F (37.1 ?C)  ?SpO2: 97%  ? ? ?General: Awake, appears uncomfortable and in pain. ?Skin: Warm, dry. No rashes or lesions.  ?Eyes: PERRL. Conjunctivae normal.  ?CV: Good peripheral perfusion.  ?Resp: Normal effort.  ?Abd: Soft, non-tender. No distention.  ?Neuro: At baseline. No gross neurological deficits.  ? ?Focused Exam: Diffuse swelling across the upper lip and nose, no notable erythema or warmth.  Patient has bloody nasal packing in his nares as well.  No signs of infection. ? ?Physical Exam ? ? ? ?ED Results / Procedures /  Treatments  ?Labs ?(all labs ordered are listed, but only abnormal results are displayed) ?Labs Reviewed - No data to display ? ? ?EKG ?N/A. ? ? ?RADIOLOGY ? ?ED Provider Interpretation: N/A. ? ?No results found. ? ?PROCEDURES: ? ?Critical Care performed: N/A.   ? ?Procedures ? ? ? ?MEDICATIONS ORDERED IN ED: ?Medications  ?oxyCODONE-acetaminophen (PERCOCET/ROXICET) 5-325 MG per tablet 2 tablet (2 tablets Oral Given 04/07/22 1509)  ? ? ? ?IMPRESSION / MDM / ASSESSMENT AND PLAN / ED COURSE  ?I reviewed the triage vital signs and the nursing notes. ?             ?               ? ?Differential diagnosis includes, but is not limited to, cleft lip repair, rhinoplasty, cellulitis, postoperative pain. ? ?ED Course ?Patient appears stable, though exquisitely uncomfortable.  Vitals are within normal limits.  We will go ahead and treat with oxycodone/acetaminophen. ? ?Assessment/Plan ?Patient presents with postoperative pain from rhinoplasty/cleft lip repair 4 days prior.  No evidence of severe complications or infection.  Will provide patient with a short-term prescription for oxycodone/acetaminophen that should last  him until his next appointment on May 11.  No further work-up or treatment indicated in the emergency department this time.  We will plan to discharge. ? ?Provided the patient with anticipatory guidance, return precautions, and educational material. Encouraged the patient to return to the emergency department at any time if they begin to experience any new or worsening symptoms. Patient expressed understanding and agreed with the plan.  ? ?  ? ? ?FINAL CLINICAL IMPRESSION(S) / ED DIAGNOSES  ? ?Final diagnoses:  ?Medication refill  ?Post-op pain  ? ? ? ?Rx / DC Orders  ? ?ED Discharge Orders   ? ?      Ordered  ?  oxyCODONE-acetaminophen (PERCOCET/ROXICET) 5-325 MG tablet  Every 6 hours PRN       ? 04/07/22 1503  ? ?  ?  ? ?  ? ? ? ?Note:  This document was prepared using Dragon voice recognition software and may  include unintentional dictation errors. ?  ?Teodoro Spray, [place] ?04/07/22 1629 ? ?  ?Carrie Mew, MD ?04/08/22 2132 ? ?

## 2022-04-07 NOTE — ED Notes (Signed)
See triage note  presents requesting additional pain medication  states he had surgery and is out of pain medicine  scheduled for f/u next week   ?

## 2022-04-07 NOTE — ED Triage Notes (Signed)
Pt here needing a medication refill. Pt had cleft lip surgery and a rhinoplasty on Friday. Pt also states that took a piece of one from his right pelvis. Pt was only given 10 Roxicodone and is still having severe pain. ?

## 2022-04-07 NOTE — Discharge Instructions (Addendum)
-  Follow-up with your surgeon, as discussed ?-Continue to treat pain with ibuprofen.  You may additionally take oxycodone/acetaminophen as needed. ?-Return to the emergency department anytime if you begin to experience any new or worsening symptoms ?

## 2022-04-14 ENCOUNTER — Emergency Department
Admission: EM | Admit: 2022-04-14 | Discharge: 2022-04-14 | Disposition: A | Discharge status: Home / Self Care | Payer: Medicare Other | Attending: Emergency Medicine | Admitting: Emergency Medicine

## 2022-04-14 ENCOUNTER — Other Ambulatory Visit: Payer: Self-pay

## 2022-04-14 ENCOUNTER — Encounter: Payer: Self-pay | Admitting: Emergency Medicine

## 2022-04-14 DIAGNOSIS — M25551 Pain in right hip: Secondary | ICD-10-CM | POA: Diagnosis present

## 2022-04-14 DIAGNOSIS — G8918 Other acute postprocedural pain: Secondary | ICD-10-CM

## 2022-04-14 MED ORDER — CYCLOBENZAPRINE HCL 5 MG PO TABS: 5.0000 mg | ORAL_TABLET | Freq: Three times a day (TID) | ORAL | 0 refills | Status: AC | PRN

## 2022-04-14 MED ORDER — LIDOCAINE 5 % EX PTCH
1.0000 | MEDICATED_PATCH | Freq: Once | CUTANEOUS | Status: DC
  Administered 2022-04-14: 1 via TRANSDERMAL
  Filled 2022-04-14: qty 1

## 2022-04-14 MED ORDER — CYCLOBENZAPRINE HCL 10 MG PO TABS
10.0000 mg | ORAL_TABLET | Freq: Once | ORAL | Status: AC
  Administered 2022-04-14: 10 mg via ORAL
  Filled 2022-04-14: qty 1

## 2022-04-14 MED ORDER — LIDOCAINE 5 % EX PTCH: 1.0000 | MEDICATED_PATCH | Freq: Two times a day (BID) | CUTANEOUS | 0 refills | Status: AC | PRN

## 2022-04-14 NOTE — Discharge Instructions (Addendum)
You will not be provided with a narcotic pain medicine from the emergency department. You must take the OTC Tylenol and Motrin as prescribed. Take the prescription muscle relaxant as needed. Follow-up with your [facility] provider on Thursday, as scheduled. Apply ice to the sore hip as needed.  ?

## 2022-04-14 NOTE — ED Provider Notes (Signed)
? ? ?[facility] ?Emergency Department Provider Note ? ? ? ? Event Date/Time  ? First MD Initiated Contact with Patient 04/14/22 1919   ?  (approximate) ? ? ?History  ? ?Facial Pain and Hip Pain ? ? ?HPI ? ?Zachary Soto is a 21 y.o. male nearly 3 weeks status post cleft palate repair with right hip autograft.  She was evaluated in the ED 1 week prior with complaints of acute on chronic pain, and given a prescription for oxycodone #30.  He presents today having completed all 30 tablets, and requesting a refill on the narcotic again citing severe pain.  Patient scheduled to see his [facility] plastic surgery group in 3 days.  Patient denies any interim fevers, chills, sweats. ? ? ?Physical Exam  ? ?Triage Vital Signs: ?ED Triage Vitals  ?Enc Vitals Group  ?   BP 04/14/22 1829 (!) 127/91  ?   Pulse Rate 04/14/22 1829 66  ?   Resp 04/14/22 1829 18  ?   Temp 04/14/22 1829 97.6 ?F (36.4 ?C)  ?   Temp Source 04/14/22 1829 Oral  ?   SpO2 04/14/22 1829 96 %  ?   Weight --   ?   Height --   ?   Head Circumference --   ?   Peak Flow --   ?   Pain Score 04/14/22 1838 10  ?   Pain Loc --   ?   Pain Edu? --   ?   Excl. in [place]? --   ? ? ?Most recent vital signs: ?Vitals:  ? 04/14/22 1829  ?BP: (!) 127/91  ?Pulse: 66  ?Resp: 18  ?Temp: 97.6 ?F (36.4 ?C)  ?SpO2: 96%  ? ? ?General Awake, no distress.  ?HEENT NCAT. PERRL. EOMI. No rhinorrhea.  Septal bridge edema consistent with recent repair.  Mucous membranes are moist.  ?CV:  Good peripheral perfusion.  ?RESP:  Normal effort.  ?ABD:  No distention.  ? ? ?ED Results / Procedures / Treatments  ? ?Labs ?(all labs ordered are listed, but only abnormal results are displayed) ?Labs Reviewed - No data to display ? ? ?EKG ? ? ?RADIOLOGY ? ? ?No results found. ? ? ?PROCEDURES: ? ?Critical Care performed: No ? ?Procedures ? ? ?MEDICATIONS ORDERED IN ED: ?Medications  ?cyclobenzaprine (FLEXERIL) tablet 10 mg (has no administration in time range)  ?lidocaine  (LIDODERM) 5 % 1 patch (has no administration in time range)  ? ? ? ?IMPRESSION / MDM / ASSESSMENT AND PLAN / ED COURSE  ?I reviewed the triage vital signs and the nursing notes. ?             ?               ? ?Differential diagnosis includes, but is not limited to, postop pain, postop infection, acute on chronic pain, pain medicine refill ? ?Patient to the ED with request for narcotic pain medicine refill.  Patient is almost 3 weeks status post a cleft palate repair with right hip autograft.  Patient is in no acute distress at this time with no signs of acute febrile illness.  Well-healed surgical scars noted.  Patient's diagnosis is consistent with scalp pain. Patient will be discharged home with prescriptions for Flexeril Meti-Derm patches. Patient is to follow up with surgery provider as scheduled as needed or otherwise directed. Patient is given ED precautions to return to the ED for any worsening or new symptoms. ? ? ?  ? ? ?  FINAL CLINICAL IMPRESSION(S) / ED DIAGNOSES  ? ?Final diagnoses:  ?Right hip pain  ?Post-op pain  ? ? ? ?Rx / DC Orders  ? ?ED Discharge Orders   ? ?      Ordered  ?  cyclobenzaprine (FLEXERIL) 5 MG tablet  3 times daily PRN       ? 04/14/22 2022  ?  lidocaine (LIDODERM) 5 %  Every 12 hours PRN       ? 04/14/22 2022  ? ?  ?  ? ?  ? ? ? ?Note:  This document was prepared using Dragon voice recognition software and may include unintentional dictation errors. ? ?  ?Melvenia Needles, PA-C ?04/14/22 2027 ? ?  ?Rada Hay, MD ?04/14/22 2104 ? ?

## 2022-04-14 NOTE — ED Triage Notes (Signed)
Pt presents via POV with c/o severe post op pain. Pt seen on 5/2 for same. Pt had septorhinoplasty performed and reports they also took portion of right pelvis. Pt reports he is unable to walk due to the pain. ?

## 2022-04-14 NOTE — ED Provider Triage Note (Signed)
Emergency Medicine Provider Triage Evaluation Note ? ?Zachary Soto , a 21 y.o. male  was evaluated in triage.  Pt complains of of bilateral ear pain/nasal pain/right hip pain postoperatively, after undergoing cleft lip surgery and rhinoplasty repair April 28. ?Was seen in the emergency room on 04/07/2022 for complaint of same. ?Reports that he is having severe pain and is unable to tolerate it at this time.  During last ED visit he was provided a prescription for oxycodone/Tylenol and told that this should last until his next appointment on May 11.  However, he has already used all of this medication. ?Reports that he talked to his surgeon's office and they stated that they were surgeons and would no longer provide him with pain medication. ? ?Review of Systems  ?Positive: Bilateral ear pain/nasal pain/lip pain/right hip pain ?Negative: Fever/nausea/vomiting/neck pain/dysphagia/headaches ? ?Physical Exam  ?BP (!) 127/91   Pulse 66   Temp 97.6 ?F (36.4 ?C) (Oral)   Resp 18   SpO2 96%  ?Gen:   Awake, does not appear to be in significant amount of pain. ?Resp:  Normal effort  ?MSK:   Moves extremities without difficulty  ?Other:  Patient has diffuse swelling of upper lip and bridge of nose.  However, there is no redness/warmth/drainage.  Patient's right hip has healing trickle scar with upper border slightly red but no discharge noted ? ?Medical Decision Making  ?Medically screening exam initiated at 6:38 PM.  Appropriate orders placed.  Christiana Fuchs Cleda Daub was informed that the remainder of the evaluation will be completed by another provider, this initial triage assessment does not replace that evaluation, and the importance of remaining in the ED until their evaluation is complete. ? ?No treatment/labs indicated in triage at this time.  He will be seen by flex doctor. ?  ?Willaim Rayas, NP ?04/14/22 1842 ? ?
